# Patient Record
Sex: Female | Born: 1935 | Race: White | Hispanic: No | Smoking: Never smoker
Health system: Southern US, Community
[De-identification: ages and names within clinical notes are randomized; demographics above are authoritative.]

## PROBLEM LIST (undated history)

## (undated) DIAGNOSIS — G259 Extrapyramidal and movement disorder, unspecified: Secondary | ICD-10-CM

## (undated) DIAGNOSIS — M1990 Unspecified osteoarthritis, unspecified site: Secondary | ICD-10-CM

## (undated) DIAGNOSIS — R1084 Generalized abdominal pain: Secondary | ICD-10-CM

## (undated) DIAGNOSIS — I10 Essential (primary) hypertension: Secondary | ICD-10-CM

## (undated) DIAGNOSIS — F329 Major depressive disorder, single episode, unspecified: Secondary | ICD-10-CM

## (undated) DIAGNOSIS — IMO0002 Reserved for concepts with insufficient information to code with codable children: Secondary | ICD-10-CM

## (undated) DIAGNOSIS — F419 Anxiety disorder, unspecified: Secondary | ICD-10-CM

## (undated) DIAGNOSIS — I5032 Chronic diastolic (congestive) heart failure: Secondary | ICD-10-CM

## (undated) DIAGNOSIS — I442 Atrioventricular block, complete: Secondary | ICD-10-CM

## (undated) DIAGNOSIS — M419 Scoliosis, unspecified: Secondary | ICD-10-CM

## (undated) DIAGNOSIS — M810 Age-related osteoporosis without current pathological fracture: Secondary | ICD-10-CM

## (undated) DIAGNOSIS — R911 Solitary pulmonary nodule: Secondary | ICD-10-CM

## (undated) DIAGNOSIS — F32A Depression, unspecified: Secondary | ICD-10-CM

## (undated) DIAGNOSIS — R739 Hyperglycemia, unspecified: Secondary | ICD-10-CM

## (undated) HISTORY — DX: Major depressive disorder, single episode, unspecified: F32.9

## (undated) HISTORY — DX: Depression, unspecified: F32.A

## (undated) HISTORY — DX: Scoliosis, unspecified: M41.9

## (undated) HISTORY — PX: CHOLECYSTECTOMY: SHX55

## (undated) HISTORY — DX: Unspecified osteoarthritis, unspecified site: M19.90

## (undated) HISTORY — DX: Age-related osteoporosis without current pathological fracture: M81.0

## (undated) HISTORY — DX: Extrapyramidal and movement disorder, unspecified: G25.9

## (undated) HISTORY — DX: Reserved for concepts with insufficient information to code with codable children: IMO0002

## (undated) HISTORY — DX: Anxiety disorder, unspecified: F41.9

## (undated) HISTORY — PX: APPENDECTOMY: SHX54

---

## 2010-10-23 ENCOUNTER — Emergency Department (HOSPITAL_COMMUNITY): Payer: Medicare Other

## 2010-10-23 ENCOUNTER — Emergency Department (HOSPITAL_COMMUNITY)
Admission: EM | Admit: 2010-10-23 | Discharge: 2010-10-23 | Disposition: A | Discharge status: Home / Self Care | Payer: Medicare Other | Attending: Emergency Medicine | Admitting: Emergency Medicine

## 2010-10-23 DIAGNOSIS — F411 Generalized anxiety disorder: Secondary | ICD-10-CM | POA: Insufficient documentation

## 2010-10-23 DIAGNOSIS — I517 Cardiomegaly: Secondary | ICD-10-CM | POA: Insufficient documentation

## 2010-10-23 DIAGNOSIS — R197 Diarrhea, unspecified: Secondary | ICD-10-CM | POA: Insufficient documentation

## 2010-10-23 DIAGNOSIS — R109 Unspecified abdominal pain: Secondary | ICD-10-CM | POA: Insufficient documentation

## 2010-10-23 DIAGNOSIS — I10 Essential (primary) hypertension: Secondary | ICD-10-CM | POA: Insufficient documentation

## 2010-10-23 LAB — DIFFERENTIAL
Basophils Absolute: 0 10*3/uL (ref 0.0–0.1)
Basophils Relative: 0 % (ref 0–1)
Eosinophils Absolute: 0 10*3/uL (ref 0.0–0.7)
Eosinophils Relative: 0 % (ref 0–5)
Monocytes Absolute: 1.1 10*3/uL — ABNORMAL HIGH (ref 0.1–1.0)

## 2010-10-23 LAB — COMPREHENSIVE METABOLIC PANEL
ALT: 15 U/L (ref 0–35)
Albumin: 3.9 g/dL (ref 3.5–5.2)
Calcium: 9.6 mg/dL (ref 8.4–10.5)
GFR calc Af Amer: >60 mL/min (ref >60)
Glucose, Bld: 114 mg/dL — ABNORMAL HIGH (ref 70–99)
Sodium: 137 mEq/L (ref 135–145)
Total Protein: 7.8 g/dL (ref 6.0–8.3)

## 2010-10-23 LAB — GLUCOSE, CAPILLARY: Glucose-Capillary: 112 mg/dL — ABNORMAL HIGH (ref 70–99)

## 2010-10-23 LAB — CBC
HCT: 42.3 % (ref 36.0–46.0)
MCHC: 34.3 g/dL (ref 30.0–36.0)
Platelets: 277 10*3/uL (ref 150–400)
RDW: 13.5 % (ref 11.5–15.5)
WBC: 13.5 10*3/uL — ABNORMAL HIGH (ref 4.0–10.5)

## 2010-10-27 ENCOUNTER — Encounter: Payer: Self-pay | Admitting: Internal Medicine

## 2010-10-27 ENCOUNTER — Ambulatory Visit (INDEPENDENT_AMBULATORY_CARE_PROVIDER_SITE_OTHER): Payer: Medicare Other | Admitting: Internal Medicine

## 2010-10-27 ENCOUNTER — Encounter: Payer: Self-pay | Admitting: Family Medicine

## 2010-10-27 DIAGNOSIS — R350 Frequency of micturition: Secondary | ICD-10-CM

## 2010-10-27 DIAGNOSIS — F1120 Opioid dependence, uncomplicated: Secondary | ICD-10-CM

## 2010-10-27 DIAGNOSIS — I10 Essential (primary) hypertension: Secondary | ICD-10-CM

## 2010-10-27 DIAGNOSIS — M549 Dorsalgia, unspecified: Secondary | ICD-10-CM

## 2010-10-27 DIAGNOSIS — G8929 Other chronic pain: Secondary | ICD-10-CM

## 2010-10-27 LAB — POCT URINALYSIS DIPSTICK
Bilirubin, UA: NEGATIVE
Ketones, UA: NEGATIVE
Leukocytes, UA: NEGATIVE
Spec Grav, UA: 1.01
pH, UA: 5.5

## 2010-10-27 MED ORDER — HYDROCODONE-ACETAMINOPHEN 5-500 MG PO TABS: 1.0000 | ORAL_TABLET | Freq: Four times a day (QID) | ORAL | Status: DC | PRN

## 2010-10-28 LAB — DRUG SCREEN, URINE
Amphetamine Screen, Ur: NEGATIVE
Barbiturate Quant, Ur: NEGATIVE
Benzodiazepines.: NEGATIVE
Cocaine Metabolites: NEGATIVE
Marijuana Metabolite: NEGATIVE

## 2010-10-29 ENCOUNTER — Telehealth: Payer: Self-pay

## 2010-10-29 ENCOUNTER — Telehealth: Payer: Self-pay | Admitting: *Deleted

## 2010-10-29 NOTE — Telephone Encounter (Signed)
Called [facility] and receptionist said that she would have pt call back on her cell phone.

## 2010-10-29 NOTE — Telephone Encounter (Addendum)
Pt is calling to have Fosamax called to [facility] ([address]) and to have results of UA called to her,  please.

## 2010-10-29 NOTE — Telephone Encounter (Signed)
Pt returned call. She states that fosamax is listed on the medication list that was given to her from Dr. Rodena Medin, however, I read to her the list of meds that we have on file. It does not include fosamax. She states that she told Dr. Rodena Medin to rx the fosamax because she used to take it in [place] for years. Per Dr. Rodena Medin, there was no mention of fosamax. Pt also did not mention taking fosamax to me when I was entering her medication and asked if there was any other medications that she was on that she did not bring with her. Advised pt that when we get her records from her former pcp in [place], we will then review it for a dx of osteoporosis and an rx of fosamax. I also told pt if she has a rx bottle for the fosamax, then she can bring it to her next ov and we will review from there. Pt verbalized understanding

## 2010-10-29 NOTE — Telephone Encounter (Signed)
Received a fax form [facility] with "New Rx for Fosamax Thank" written across the top of the refill request. Pt did not indicate any use of fosamax to me or Dr. Rodena Medin during her ov on 10/27/2010. Pharmacy notes they received a call from pt stating that Dr. Rodena Medin was suppose to send an rx for fosamax, however Dr. Rodena Medin did not prescribe this medication to the pt. After speaking with the pt's daughter, Misty Stanley, she states that the pt told her that Dr. Rodena Medin said pt has osteoporosis and needs the med. Per Dr. Rodena Medin, he never said anything about fosamax to the pt. Pt would have to be tested for osteoporosis to get that dx. P'st state of mind was questionable while in the office. Dr. Rodena Medin notes that pt mentioned having osteoporosis a few times along with asking for different medication. Pt's daughter stats that pt would not have known the name of the med to tell the pharmacy if it had not been said to her but daughter did note that the family does know that pt gets confused and forgets a lot.

## 2010-11-08 ENCOUNTER — Encounter: Payer: Self-pay | Admitting: Internal Medicine

## 2010-11-08 ENCOUNTER — Other Ambulatory Visit: Payer: Self-pay | Admitting: Internal Medicine

## 2010-11-08 DIAGNOSIS — G8929 Other chronic pain: Secondary | ICD-10-CM | POA: Insufficient documentation

## 2010-11-08 DIAGNOSIS — R350 Frequency of micturition: Secondary | ICD-10-CM | POA: Insufficient documentation

## 2010-11-08 NOTE — Assessment & Plan Note (Signed)
Obtain urinalysis 

## 2010-11-08 NOTE — Assessment & Plan Note (Signed)
No records available. Cannot confirm long-term use of narcotics. No evidence of withdrawal clinically. Agreed to short-term narcotics with Vicodin p.r.n. to be used sparingly. Urine drug screen today. Advise will not continue narcotics long-term and recommend pain clinic referral. Patient agreeable and appointment will be made.

## 2010-11-08 NOTE — Assessment & Plan Note (Signed)
suboptimal control contributed to by noncompliance. Asymptomatic. Urged patient to resume her blood pressure medication and it is refilled today. Monitor blood pressure as an outpatient and followup in clinic as scheduled

## 2010-11-08 NOTE — Progress Notes (Signed)
  Subjective:    Patient ID: Darlene Parker, female    DOB: 1935-09-04, 75 y.o.   MRN: [id number]  HPI patient presents to clinic to establish care and for evaluation of chronic pain. Recent emergency department records are reviewed however no other records are available at this time. Patient states she recently moved from [place] and has chronic back pain and takes narcotics chronically specifically opana. Her emergency department visit documentation states that she complained of diarrhea and anxiety being out of pain medication but declined pain medication. Was given a prescription for Ativan which she states does not help her sleep or anxiety. Does not currently demonstrate any evidence of agitation suggestive of withdrawal but does have a tremor that she states is chronic and familial. Has history of hypertension and blood pressure is elevated and she admits she is not taking her blood pressure medications as prescribed. Complains of recent several-day history of urinary frequency and dark urine without dysuria or hematuria. No alleviating or exacerbating factors.   Reviewed past medical history, past surgical history, medications, allergies, social history and family history  Review of Systems  Constitutional: Negative for fever and chills.  HENT: Negative for congestion and rhinorrhea.   Eyes: Negative for pain and redness.  Respiratory: Negative for cough and shortness of breath.   Cardiovascular: Negative for chest pain and palpitations.  Gastrointestinal: Negative for nausea and abdominal pain.  Genitourinary: Positive for frequency. Negative for urgency, hematuria and decreased urine volume.  Musculoskeletal: Positive for back pain. Negative for gait problem.  Skin: Negative for rash and wound.  Neurological: Positive for tremors. Negative for weakness.  Psychiatric/Behavioral: Negative for agitation. The patient is nervous/anxious.        Objective:   Physical Exam  Nursing note  and vitals reviewed. Constitutional: She appears well-developed and well-nourished. No distress.  HENT:  Head: Normocephalic and atraumatic.  Right Ear: External ear normal.  Left Ear: External ear normal.  Nose: Nose normal.  Eyes: Conjunctivae are normal. Pupils are equal, round, and reactive to light. Right eye exhibits no discharge. Left eye exhibits no discharge. No scleral icterus. Right eye exhibits no nystagmus.  Neck: Neck supple.  Cardiovascular: Normal rate and regular rhythm.  Exam reveals no gallop and no friction rub.   No murmur heard. Pulmonary/Chest: Effort normal and breath sounds normal. No respiratory distress. She has no wheezes. She has no rales.  Lymphadenopathy:    She has no cervical adenopathy.  Neurological: She is alert.  Skin: Skin is warm and dry. No rash noted. She is not diaphoretic. No erythema.  Psychiatric: She has a normal mood and affect.          Assessment & Plan:

## 2010-11-09 ENCOUNTER — Other Ambulatory Visit: Payer: Self-pay | Admitting: Internal Medicine

## 2010-11-09 NOTE — Telephone Encounter (Signed)
Left message with front desk receptionist at [facility] for pt to call offc to discuss med refills.   Pt has not returned call as of yet. Will await call before any refills are given.

## 2010-11-11 ENCOUNTER — Telehealth: Payer: Self-pay | Admitting: Internal Medicine

## 2010-11-11 ENCOUNTER — Other Ambulatory Visit: Payer: Self-pay

## 2010-11-11 MED ORDER — LORAZEPAM 1 MG PO TABS: 1.0000 mg | ORAL_TABLET | Freq: Three times a day (TID) | ORAL | Status: DC | PRN

## 2010-11-11 NOTE — Telephone Encounter (Signed)
Pt called and is wondering why she is only being prescribed a 30 day supply on pain meds. Pt said that the Vicodin is not strong enough. Pt also says that Ativan is not helping her with sleep.  Pt is req to get a stronger pain med and different sleep called in to [facility] on [address].

## 2010-11-12 NOTE — Telephone Encounter (Signed)
Spoke with pt. Per Dr. Rodena Medin, make pt aware that he is not comfortable prescribing anymore narcotics to the pt without having seen her medical records form [place]. We are in the process of trying to get pt an appt with a pain clinic and ativan is not a sleep aid, but if pt would like a rx for ambien then Dr. Sharlot Gowda will do that when pt is off the ativan..when pt's urine was drug tested, it was clean for all drugs including benzo's, so there is question about where the ativan is going if it is not being taken by the pt.  Pt demands to speak to Dr. Rodena Medin or for him to give her refills and stronger pain meds, meds for sleep, and meds for anxiety or she states that he (Dr. Rodena Medin) needs to find her another MD.

## 2010-11-12 NOTE — Telephone Encounter (Signed)
I have several concerns.   During first visit requested opana be refilled. Appeared lethargic and repeatedly requested high dose narcotics, sleeping medication, anxiety medication and ?stimulant medication for daytime to help her stay awake. No records are currently available from her previous [place] physician. Received ativan from recent ED visit and denied pain there. Urine drug screen in clinic showed no benzodiazepines or opiates. No evidence of withdrawal during visit.  Agreed to provide patient with short acting narcotics short term until pain clinic appt obtained(attempting referral currently pending). She refilled #30 hydrocodone ~ 4 days after originally filled. Has called requesting ativan rf and now demanding increase in narcotic, further benzodiazepines and sleeping medication.  Indicated to nursing that she wishes to change physicians.  I have concerns over her use of controlled substances and do not feel comfortable in providing what she is requesting. I whole heartedly agree with her request to change providers to one that may feel more comfortable with what she is asking for.    As she has a recent prescription for ativan and hydrocodone and has demonstrated no evidence of withdrawal (despite neg drug screen) I feel she is stable to transfer to another physician at this point.

## 2010-11-13 ENCOUNTER — Encounter: Payer: Self-pay | Admitting: *Deleted

## 2010-11-17 ENCOUNTER — Encounter: Payer: Self-pay | Admitting: Internal Medicine

## 2010-11-17 ENCOUNTER — Ambulatory Visit (INDEPENDENT_AMBULATORY_CARE_PROVIDER_SITE_OTHER): Payer: Medicare Other | Admitting: Internal Medicine

## 2010-11-17 DIAGNOSIS — G8929 Other chronic pain: Secondary | ICD-10-CM

## 2010-11-17 DIAGNOSIS — G4700 Insomnia, unspecified: Secondary | ICD-10-CM

## 2010-11-17 DIAGNOSIS — I10 Essential (primary) hypertension: Secondary | ICD-10-CM

## 2010-11-17 DIAGNOSIS — M549 Dorsalgia, unspecified: Secondary | ICD-10-CM

## 2010-11-17 MED ORDER — OXYCODONE-ACETAMINOPHEN 7.5-325 MG PO TABS: 1.0000 | ORAL_TABLET | Freq: Four times a day (QID) | ORAL | Status: DC | PRN

## 2010-11-17 MED ORDER — ZOLPIDEM TARTRATE 5 MG PO TABS: 5.0000 mg | ORAL_TABLET | Freq: Every evening | ORAL | Status: DC | PRN

## 2010-11-22 ENCOUNTER — Encounter: Payer: Self-pay | Admitting: Internal Medicine

## 2010-11-22 DIAGNOSIS — G4700 Insomnia, unspecified: Secondary | ICD-10-CM | POA: Insufficient documentation

## 2010-11-22 NOTE — Assessment & Plan Note (Signed)
Long discussion held with patient. I have concerns about her recent behavior involving controlled substances. Do not feel comfortable with long-acting narcotic prescription. Strongly encouraged pain clinic referral which is currently pending. Have again indicated I will not provide long-term narcotic prescriptions and she states understanding. Change Vicodin to Percocet with limited quantity.

## 2010-11-22 NOTE — Progress Notes (Signed)
  Subjective:    Patient ID: Darlene Parker, female    DOB: 04-23-1936, 75 y.o.   MRN: [id number]  HPI patient presents to clinic for followup of chronic pain. Suffers from chronic back pain recently moved from [place] and no records are available. States had previously been prescribed opana. Drug screen last visit entirely negative and had no evidence of withdrawal. He agreed to place patient on short-term p.r.n. Narcotic until pain clinic referral completed. Review of chart, narcotic database indicates patient filled #30 hydrocodone after last visit and refilled #30 4 days later.  This was confirmed by her pharmacy. Patient denies this. Discuss recent phone call to the nursing staff made by patient. Nursing staff indicates patient stated if I did not give her narcotics and she wished that she desired another physician. Denies this currently. Also was given Ativan for anxiety at the emergency department and at last visit stated the medication did not help very much. I reviewed her drug screen showing no evidence of benzodiazepines. She states she does continue to take the medication. Patient requesting medication for insomnia indicating she is taking prescription medication for this in the past. Patient with history of hypertension with improved control today as she is now apparently resumed her blood pressure medication as instructed at last visit. No other current complaints  Reviewed past medical history, medications and allergies  Review of Systems see history of present illness     Objective:   Physical Exam  [nursing notereviewed. Constitutional: She appears well-developed and well-nourished. No distress.  Neurological: She is alert.  Skin: Skin is warm and dry. She is not diaphoretic.  Psychiatric: Her mood appears not anxious. Her affect is not angry, not blunt, not labile and not inappropriate. Her speech is not rapid and/or pressured, not delayed, not tangential and not slurred. She is  not agitated, not aggressive, is not hyperactive, not slowed, not withdrawn, not actively hallucinating and not combative. Thought content is not paranoid and not delusional. She does not exhibit a depressed mood. She is communicative. She is attentive.          Assessment & Plan:

## 2010-11-22 NOTE — Assessment & Plan Note (Signed)
Begin low-dose Ambien and limited quantities. Upon discussion of this patient immediately requested high dose of medication. Will provide low dose only.  Addendum: Patient instructed to wait in the room for printed prescriptions and stated understanding. approximately 2 minutes later left the room and informed nursing that she was told she was discharged. Began to request phentermine. Request denied.

## 2010-11-22 NOTE — Assessment & Plan Note (Signed)
Improving control. Encouraged compliance

## 2010-11-24 ENCOUNTER — Telehealth: Payer: Self-pay | Admitting: Internal Medicine

## 2010-11-24 NOTE — Telephone Encounter (Signed)
Returned call to pt twice

## 2010-11-24 NOTE — Telephone Encounter (Signed)
Pt called req to get and FL-2 form to be completed by Dr Rodena Medin and then faxed to her fax # [phone] attn [facility] c/o Genelle Bal.

## 2010-12-03 ENCOUNTER — Other Ambulatory Visit: Payer: Self-pay

## 2010-12-03 MED ORDER — LORAZEPAM 1 MG PO TABS: 1.0000 mg | ORAL_TABLET | Freq: Three times a day (TID) | ORAL | Status: DC | PRN

## 2010-12-03 MED ORDER — OXYCODONE-ACETAMINOPHEN 7.5-325 MG PO TABS: 1.0000 | ORAL_TABLET | Freq: Four times a day (QID) | ORAL | Status: DC | PRN

## 2010-12-03 NOTE — Telephone Encounter (Signed)
Done. Pt aware rx'es ready to pick up

## 2010-12-03 NOTE — Telephone Encounter (Signed)
Ok to fill #60 percocet and #60 ativan. Remind that will not fill narcotics long term. Await pain clinic appt.

## 2010-12-03 NOTE — Telephone Encounter (Signed)
Pt left message stating that she does not have enough Percocet and Ativan to last her until the end of the month. Notes she has been taking them as directed and has 5 pain pills and 4 or 5 ativan left. Please advise.   Pain Clinic referral still pending

## 2010-12-04 ENCOUNTER — Telehealth: Payer: Self-pay

## 2010-12-04 ENCOUNTER — Other Ambulatory Visit: Payer: Self-pay

## 2010-12-04 MED ORDER — CLONAZEPAM 1 MG PO TABS: 1.0000 mg | ORAL_TABLET | Freq: Two times a day (BID) | ORAL | Status: DC | PRN

## 2010-12-04 NOTE — Telephone Encounter (Signed)
Done and pt aware

## 2010-12-04 NOTE — Telephone Encounter (Signed)
Confirmed with pharmacy. May change lorazepam to klonopin 1mg  po bid #60 no rf

## 2010-12-04 NOTE — Telephone Encounter (Signed)
Pt left message stating that her insurance does not cover ativan so she would like to switch to clonazepam.

## 2010-12-04 NOTE — Telephone Encounter (Signed)
Pt came into office today to pick up 2 prescriptions at which time she demanded (banging her pill bottles on the counter) that Dr. Rodena Medin increase her Ativan rx and give her something for her nerves. I explained to the pt that Ativan is for nerves/anxiety and that Dr. Rodena Medin is not going to increase it because he is uncomfortable with prescribing the meds due to the fact that we have yet to receive medical records on pt from previous physician's office. Pt then demanded that Dr. Rodena Medin give her another doctor...one who will give her the medicines that she wants, per pt. She proceeded to lay pills (Ativan) out on the counter, saying that the pills are too small so how could they work. I told the pt that I have no control over the size of the pill. She ended with me saying she wants another doctor.

## 2010-12-08 ENCOUNTER — Encounter: Payer: Self-pay | Admitting: *Deleted

## 2010-12-09 ENCOUNTER — Other Ambulatory Visit: Payer: Self-pay

## 2010-12-09 NOTE — Telephone Encounter (Signed)
Fax refill request for ambien 5 mg #30 has been denied by Dr. Rodena Medin. Reason: pt should not be out of [place]. She was given an rx for a 30 day supply on May 29/ 2012

## 2010-12-14 ENCOUNTER — Other Ambulatory Visit: Payer: Self-pay

## 2010-12-14 NOTE — Telephone Encounter (Signed)
Rx refill request for ambien denied, per Dr. Rodena Medin. Too soon to refill as rx was written 11/17/10

## 2010-12-16 ENCOUNTER — Telehealth: Payer: Self-pay | Admitting: *Deleted

## 2010-12-16 NOTE — Telephone Encounter (Signed)
Pt has contacted [facility] requesting assistance.  [name] from [place] states she has been on the phone with the patient for about 30 minutes and is having difficulty in understanding what the patient needs.  She assumes the patient needs a Set designer and also thinks social services needs to be involved and possible the patient needs to be moved to another facility.  I advised [name] that patient has been discharged from our office.  However she is requesting a call back to see what Dr. Rodena Medin feels would be beneficial to the patient as far as treatment is concerned.

## 2010-12-17 ENCOUNTER — Telehealth: Payer: Self-pay

## 2010-12-17 NOTE — Telephone Encounter (Signed)
Pt entered office requesting medication refills for the following meds: Ambien of which she has 3 pills left, Percocet of which she has 6 left, and Ativan that appeared to be 10 or more.  Pt also requests that an FL-2 form be completed on her behalf. Pt was advised that there has to be an Assisted Living Home chosen before the facility can send the form to be considered for completion.  Pt stated that she has not received the certified discharge letter as of today.

## 2010-12-18 ENCOUNTER — Other Ambulatory Visit: Payer: Self-pay

## 2010-12-18 ENCOUNTER — Telehealth: Payer: Self-pay

## 2010-12-18 MED ORDER — ZOLPIDEM TARTRATE 5 MG PO TABS: 5.0000 mg | ORAL_TABLET | Freq: Every evening | ORAL | Status: DC | PRN

## 2010-12-18 MED ORDER — OXYCODONE-ACETAMINOPHEN 7.5-325 MG PO TABS: 1.0000 | ORAL_TABLET | Freq: Four times a day (QID) | ORAL | Status: AC | PRN

## 2010-12-18 NOTE — Telephone Encounter (Signed)
Done and pt is aware 

## 2010-12-18 NOTE — Telephone Encounter (Signed)
rx faxed to pharmacy per pt request

## 2010-12-18 NOTE — Telephone Encounter (Signed)
Fill ambien and percocet.

## 2010-12-18 NOTE — Telephone Encounter (Signed)
i am unclear what they are requesting from the phone call. As you said she has been discharged from the practice and is eligible for 30 days emergency care. Did she receive the registered letter or did we receive the refusal? Was she told of discharge when she came to clinic yesterday?

## 2010-12-18 NOTE — Telephone Encounter (Signed)
Pt notified scripts ready to pick up

## 2010-12-18 NOTE — Telephone Encounter (Signed)
done

## 2010-12-18 NOTE — Telephone Encounter (Signed)
Discharge letter mailed to patient, however she states she has not received.  Waiting to hear back from Medical Records to see what the status of the letter is, was if refused by patient or was it signed and the patient has received.  At this point I do not know.    [name] from [place] has no medical hx on this patient as was reaching out to see if patient has medical or mental illness that would benefit her to have a home health nurse, or possible help from social services.  I think she wants to know if you feel the patient would benefit from either of these services based on your interactions with the patient.

## 2010-12-22 ENCOUNTER — Telehealth: Payer: Self-pay

## 2010-12-22 NOTE — Telephone Encounter (Signed)
Pt left message requesting a call back in reference to Dr. Rodena Medin calling [name] (person named in a previous phone note).   Pt was asked if she received a letter form Dr. Rodena Medin. She states that she did receive the discharge letter 2-3 days ago.  Returned call to pt to advise that Dr. Rodena Medin is unable to speak with [name] due to pt's discharge from the practice. Pt states that [name] wants to speak with Dr. Rodena Medin so verify her being discharged. Pt states that she will allow [name] to see the discharge letter to see if that will be sufficient for proof of discharge from practice.

## 2011-01-14 ENCOUNTER — Emergency Department (HOSPITAL_COMMUNITY): Payer: Medicare Other

## 2011-01-14 ENCOUNTER — Emergency Department (HOSPITAL_COMMUNITY)
Admission: EM | Admit: 2011-01-14 | Discharge: 2011-01-14 | Disposition: A | Discharge status: Home / Self Care | Payer: Medicare Other | Attending: Emergency Medicine | Admitting: Emergency Medicine

## 2011-01-14 DIAGNOSIS — I4510 Unspecified right bundle-branch block: Secondary | ICD-10-CM | POA: Insufficient documentation

## 2011-01-14 DIAGNOSIS — M7989 Other specified soft tissue disorders: Secondary | ICD-10-CM | POA: Insufficient documentation

## 2011-01-14 DIAGNOSIS — E669 Obesity, unspecified: Secondary | ICD-10-CM | POA: Insufficient documentation

## 2011-01-14 DIAGNOSIS — M79609 Pain in unspecified limb: Secondary | ICD-10-CM | POA: Insufficient documentation

## 2011-01-14 DIAGNOSIS — I440 Atrioventricular block, first degree: Secondary | ICD-10-CM | POA: Insufficient documentation

## 2011-01-14 DIAGNOSIS — I2510 Atherosclerotic heart disease of native coronary artery without angina pectoris: Secondary | ICD-10-CM | POA: Insufficient documentation

## 2011-01-14 DIAGNOSIS — R609 Edema, unspecified: Secondary | ICD-10-CM | POA: Insufficient documentation

## 2011-01-14 DIAGNOSIS — I10 Essential (primary) hypertension: Secondary | ICD-10-CM | POA: Insufficient documentation

## 2011-02-18 ENCOUNTER — Other Ambulatory Visit: Payer: Self-pay | Admitting: Neurology

## 2011-02-18 DIAGNOSIS — M47817 Spondylosis without myelopathy or radiculopathy, lumbosacral region: Secondary | ICD-10-CM

## 2011-03-10 ENCOUNTER — Other Ambulatory Visit: Payer: Self-pay

## 2011-03-10 ENCOUNTER — Ambulatory Visit
Admission: RE | Admit: 2011-03-10 | Discharge: 2011-03-10 | Disposition: A | Discharge status: Home / Self Care | Payer: Medicare Other | Source: Ambulatory Visit | Attending: Neurology | Admitting: Neurology

## 2011-03-10 DIAGNOSIS — M47817 Spondylosis without myelopathy or radiculopathy, lumbosacral region: Secondary | ICD-10-CM

## 2011-03-24 ENCOUNTER — Other Ambulatory Visit: Payer: Self-pay | Admitting: Neurology

## 2011-03-24 DIAGNOSIS — M5410 Radiculopathy, site unspecified: Secondary | ICD-10-CM

## 2011-03-29 ENCOUNTER — Ambulatory Visit
Admission: RE | Admit: 2011-03-29 | Discharge: 2011-03-29 | Disposition: A | Discharge status: Home / Self Care | Payer: Medicare Other | Source: Ambulatory Visit | Attending: Neurology | Admitting: Neurology

## 2011-03-29 DIAGNOSIS — M5410 Radiculopathy, site unspecified: Secondary | ICD-10-CM

## 2011-03-29 MED ORDER — METHYLPREDNISOLONE ACETATE 40 MG/ML INJ SUSP (RADIOLOG
120.0000 mg | Freq: Once | INTRAMUSCULAR | Status: AC
  Administered 2011-03-29: 120 mg via EPIDURAL

## 2011-03-29 MED ORDER — IOHEXOL 180 MG/ML  SOLN
1.0000 mL | Freq: Once | INTRAMUSCULAR | Status: AC | PRN
  Administered 2011-03-29: 1 mL via EPIDURAL

## 2011-03-29 NOTE — Patient Instructions (Signed)

## 2011-03-29 NOTE — Progress Notes (Signed)
Pt very nervous and tearful, took own pain meds upon discharge. Seemed very confused about how and why she had an injection, even after long explanation from Dr. Alfredo Batty. Pt asked to follow up with Dr. Anne Hahn.

## 2011-04-12 ENCOUNTER — Other Ambulatory Visit: Payer: Self-pay | Admitting: Neurology

## 2011-04-12 DIAGNOSIS — R51 Headache: Secondary | ICD-10-CM

## 2011-04-21 ENCOUNTER — Ambulatory Visit
Admission: RE | Admit: 2011-04-21 | Discharge: 2011-04-21 | Disposition: A | Discharge status: Home / Self Care | Payer: Medicare Other | Source: Ambulatory Visit | Attending: Neurology | Admitting: Neurology

## 2011-04-21 ENCOUNTER — Other Ambulatory Visit: Payer: Medicare Other

## 2011-04-21 DIAGNOSIS — R51 Headache: Secondary | ICD-10-CM

## 2011-05-28 ENCOUNTER — Other Ambulatory Visit: Payer: Self-pay | Admitting: Obstetrics and Gynecology

## 2011-05-28 DIAGNOSIS — Z1231 Encounter for screening mammogram for malignant neoplasm of breast: Secondary | ICD-10-CM

## 2011-06-02 ENCOUNTER — Ambulatory Visit: Payer: Medicare Other

## 2011-06-02 ENCOUNTER — Ambulatory Visit
Admission: RE | Admit: 2011-06-02 | Discharge: 2011-06-02 | Disposition: A | Discharge status: Home / Self Care | Payer: Medicare Other | Source: Ambulatory Visit | Attending: Obstetrics and Gynecology | Admitting: Obstetrics and Gynecology

## 2011-06-02 DIAGNOSIS — Z1231 Encounter for screening mammogram for malignant neoplasm of breast: Secondary | ICD-10-CM

## 2012-07-10 ENCOUNTER — Other Ambulatory Visit: Payer: Self-pay | Admitting: Family Medicine

## 2012-07-10 DIAGNOSIS — R911 Solitary pulmonary nodule: Secondary | ICD-10-CM

## 2012-07-11 ENCOUNTER — Encounter: Payer: Self-pay | Admitting: Physical Medicine & Rehabilitation

## 2012-07-12 ENCOUNTER — Ambulatory Visit
Admission: RE | Admit: 2012-07-12 | Discharge: 2012-07-12 | Disposition: A | Discharge status: Home / Self Care | Payer: Medicare HMO | Source: Ambulatory Visit | Attending: Family Medicine | Admitting: Family Medicine

## 2012-07-12 DIAGNOSIS — R911 Solitary pulmonary nodule: Secondary | ICD-10-CM

## 2012-07-12 MED ORDER — IOHEXOL 300 MG/ML  SOLN
75.0000 mL | Freq: Once | INTRAMUSCULAR | Status: AC | PRN
  Administered 2012-07-12: 75 mL via INTRAVENOUS

## 2012-07-31 ENCOUNTER — Ambulatory Visit: Payer: Medicare HMO | Admitting: Physical Medicine & Rehabilitation

## 2012-08-11 ENCOUNTER — Encounter: Payer: Medicare HMO | Attending: Physical Medicine & Rehabilitation

## 2012-08-11 ENCOUNTER — Ambulatory Visit: Payer: Medicare HMO | Admitting: Physical Medicine & Rehabilitation

## 2012-10-31 ENCOUNTER — Telehealth: Payer: Self-pay | Admitting: *Deleted

## 2012-10-31 NOTE — Telephone Encounter (Signed)
I called and left a message for the patient to callback to the office because no one could understand the message.

## 2012-11-02 ENCOUNTER — Other Ambulatory Visit: Payer: Self-pay

## 2012-11-02 MED ORDER — GABAPENTIN 100 MG PO CAPS: 200.0000 mg | ORAL_CAPSULE | Freq: Three times a day (TID) | ORAL | Status: DC

## 2012-11-03 ENCOUNTER — Other Ambulatory Visit: Payer: Self-pay

## 2012-11-03 MED ORDER — TRAMADOL HCL 50 MG PO TABS: 50.0000 mg | ORAL_TABLET | Freq: Four times a day (QID) | ORAL | Status: DC | PRN

## 2012-11-03 MED ORDER — TRAMADOL HCL 50 MG PO TABS
50.0000 mg | ORAL_TABLET | Freq: Four times a day (QID) | ORAL | Status: DC | PRN
Start: 2012-11-03 — End: 2012-12-19

## 2012-11-03 NOTE — Telephone Encounter (Signed)
Rx did not print, reprinted.   

## 2012-11-09 ENCOUNTER — Emergency Department (HOSPITAL_COMMUNITY): Payer: Medicare HMO

## 2012-11-09 ENCOUNTER — Emergency Department (HOSPITAL_COMMUNITY)
Admission: EM | Admit: 2012-11-09 | Discharge: 2012-11-10 | Disposition: A | Discharge status: Home / Self Care | Payer: Medicare HMO | Attending: Emergency Medicine | Admitting: Emergency Medicine

## 2012-11-09 ENCOUNTER — Encounter (HOSPITAL_COMMUNITY): Payer: Self-pay | Admitting: Emergency Medicine

## 2012-11-09 DIAGNOSIS — Z8739 Personal history of other diseases of the musculoskeletal system and connective tissue: Secondary | ICD-10-CM | POA: Insufficient documentation

## 2012-11-09 DIAGNOSIS — R0789 Other chest pain: Secondary | ICD-10-CM | POA: Insufficient documentation

## 2012-11-09 DIAGNOSIS — Z87891 Personal history of nicotine dependence: Secondary | ICD-10-CM | POA: Insufficient documentation

## 2012-11-09 DIAGNOSIS — I10 Essential (primary) hypertension: Secondary | ICD-10-CM | POA: Insufficient documentation

## 2012-11-09 DIAGNOSIS — R0602 Shortness of breath: Secondary | ICD-10-CM | POA: Insufficient documentation

## 2012-11-09 DIAGNOSIS — Z79899 Other long term (current) drug therapy: Secondary | ICD-10-CM | POA: Insufficient documentation

## 2012-11-09 DIAGNOSIS — R079 Chest pain, unspecified: Secondary | ICD-10-CM

## 2012-11-09 LAB — BASIC METABOLIC PANEL
Calcium: 9.2 mg/dL (ref 8.4–10.5)
GFR calc Af Amer: 75 mL/min — ABNORMAL LOW (ref >90)
GFR calc non Af Amer: 65 mL/min — ABNORMAL LOW (ref >90)
Potassium: 3.5 mEq/L (ref 3.5–5.1)
Sodium: 137 mEq/L (ref 135–145)

## 2012-11-09 LAB — CBC WITH DIFFERENTIAL/PLATELET
Basophils Absolute: 0 10*3/uL (ref 0.0–0.1)
Basophils Relative: 0 % (ref 0–1)
Eosinophils Absolute: 0.3 10*3/uL (ref 0.0–0.7)
Eosinophils Relative: 4 % (ref 0–5)
Lymphs Abs: 2.3 10*3/uL (ref 0.7–4.0)
MCH: 31.3 pg (ref 26.0–34.0)
MCV: 91.9 fL (ref 78.0–100.0)
Neutrophils Relative %: 55 % (ref 43–77)
Platelets: 228 10*3/uL (ref 150–400)
RBC: 4.47 MIL/uL (ref 3.87–5.11)
RDW: 13.5 % (ref 11.5–15.5)

## 2012-11-09 LAB — POCT I-STAT TROPONIN I: Troponin i, poc: 0 ng/mL (ref 0.00–0.08)

## 2012-11-09 MED ORDER — ASPIRIN 325 MG PO TABS
325.0000 mg | ORAL_TABLET | Freq: Once | ORAL | Status: AC
  Administered 2012-11-09: 325 mg via ORAL
  Filled 2012-11-09: qty 1

## 2012-11-09 NOTE — ED Provider Notes (Signed)
History     CSN: [id number]  Arrival date & time 11/09/12  2147   First MD Initiated Contact with Patient 11/09/12 2147      Chief Complaint  Patient presents with  . Chest Pain    Patient is a 77 y.o. female presenting with chest pain. The history is provided by the patient and the EMS personnel.  Chest Pain Pain location:  Substernal area Pain quality: pressure   Pain radiates to:  Does not radiate Pain severity:  Moderate Onset quality:  Gradual Duration: several hours ago. Timing:  Constant Progression:  Improving Chronicity:  New Relieved by:  Nitroglycerin Worsened by:  Nothing tried Associated symptoms: shortness of breath   Associated symptoms: no abdominal pain and not vomiting     Past Medical History  Diagnosis Date  . Arthritis     RA  . Scoliosis   . Hypertension     Past Surgical History  Procedure Laterality Date  . Appendectomy    . Cholecystectomy      No family history on file.  History  Substance Use Topics  . Smoking status: Former Games developer  . Smokeless tobacco: Not on file  . Alcohol Use: Yes    OB History   Grav Para Term Preterm Abortions TAB SAB Ect Mult Living                  Review of Systems  Respiratory: Positive for shortness of breath.   Cardiovascular: Positive for chest pain.  Gastrointestinal: Negative for vomiting and abdominal pain.  All other systems reviewed and are negative.    Allergies  Review of patient's allergies indicates no known allergies.  Home Medications   Current Outpatient Rx  Name  Route  Sig  Dispense  Refill  . gabapentin (NEURONTIN) 300 MG capsule   Oral   Take 300 mg by mouth 3 (three) times daily.         Marland Kitchen lisinopril (PRINIVIL,ZESTRIL) 20 MG tablet   Oral   Take 20 mg by mouth daily.         . mirtazapine (REMERON) 15 MG tablet   Oral   Take 15 mg by mouth at bedtime.         . nitroGLYCERIN (NITROSTAT) 0.4 MG SL tablet   Sublingual   Place 0.4 mg under the tongue  every 5 (five) minutes as needed.           . simvastatin (ZOCOR) 20 MG tablet   Oral   Take 20 mg by mouth every evening.         . traMADol (ULTRAM) 50 MG tablet   Oral   Take 1 tablet (50 mg total) by mouth every 6 (six) hours as needed (MUST LAST 28 DAYS).   60 tablet   1     Pharmacy Fax [phone]     BP 134/79  Pulse 105  Temp(Src) 98.7 F (37.1 C) (Oral)  Resp 16  SpO2 96% BP 139/83  Pulse 82  Temp(Src) 98.7 F (37.1 C) (Oral)  Resp 27  SpO2 99%   Physical Exam CONSTITUTIONAL: Well developed/well nourished, ill appearing HEAD: Normocephalic/atraumatic EYES: EOMI/PERRL ENMT: Mucous membranes moist NECK: supple no meningeal signs SPINE:entire spine nontender CV: S1/S2 noted, no murmurs/rubs/gallops noted LUNGS: Lungs are clear to auscultation bilaterally, no apparent distress ABDOMEN: soft, nontender, no rebound or guarding GU:no cva tenderness NEURO: Pt is awake/alert, moves all extremitiesx4 EXTREMITIES: pulses normal, full ROM SKIN: warm, color normal PSYCH: anxious, agitated  ED Course  Procedures  Labs Reviewed  CBC WITH DIFFERENTIAL  BASIC METABOLIC PANEL  POCT I-STAT TROPONIN I   Dg Chest Portable 1 View  11/09/2012   *RADIOLOGY REPORT*  Clinical Data: Left-sided chest pain and shortness of breath.  PORTABLE CHEST - 1 VIEW  Comparison: Chest x-ray 01/14/2011.  Findings: The heart is enlarged but stable.  There is tortuosity, ectasia and calcification of the thoracic aorta.  The pulmonary hila appear stable.  There are mild underlying chronic bronchitic changes and superimposed bibasilar atelectasis or infiltrates.  No pleural effusion or pulmonary edema.  The bony thorax is intact.  IMPRESSION: Bibasilar atelectasis versus infiltrates.   Original Report Authenticated By: Rudie Meyer, M.D.   Pt seen on arrival Initial EKG was negative for STEMI Pt now reports her CP is improved  12:23 AM Pt improved in the ED She is awake/alert and denies  recurrent CP Her initial CXR/labs are unremarkable She denied pleuritic CP I advised admission for her CP.  I advised that this could be unstable angina, and she could have recurrent CP that could lead to death/disability.  Pt requests to be discharged and f/u as outpatient I had extensive discussions with her son as well and advised him of advice for admission.  Pt still would like to go home Will start her on daily ASA, and also given referrals to PCP and cardiology  I discussed risk of death/disability of leaving against medical advice and the patient accepts these risks.  The patient is awake/alet able to make decisions, and not intoxicated Patient discharged against medical advice.    MDM  Nursing notes including past medical history and social history reviewed and considered in documentation xrays reviewed and considered Labs/vital reviewed and considered        Date: 11/09/2012  Rate: 129  Rhythm: sinus tachycardia  QRS Axis: left  Intervals: normal  ST/T Wave abnormalities: nonspecific ST changes  Conduction Disutrbances:right bundle branch block  Narrative Interpretation:   Old EKG Reviewed: none available    Joya Gaskins, MD 11/10/12 0025

## 2012-11-09 NOTE — ED Notes (Signed)
PT. ARRIVED WITH EMS FROM HOME REPORTS LEFT CHEST PAIN WITH SOB ONSET THIS EVENING WITH SLIGHT SOB , NO NAUSEA OR DIAPHORESIS.

## 2012-11-10 MED ORDER — ASPIRIN EC 325 MG PO TBEC: 325.0000 mg | DELAYED_RELEASE_TABLET | Freq: Every day | ORAL | Status: DC

## 2012-11-24 ENCOUNTER — Telehealth: Payer: Self-pay

## 2012-11-24 MED ORDER — KETOROLAC TROMETHAMINE 10 MG PO TABS: 10.0000 mg | ORAL_TABLET | Freq: Four times a day (QID) | ORAL | Status: DC | PRN

## 2012-11-24 NOTE — Telephone Encounter (Signed)
Patient called saying it is an emergency, that she must get an early refill authorized on her Tramadol.  Last filled 5/16, must last 28 days, it is too soon.  Patient says she needs to get at least half of the Rx filled to last her until she is due for her next refill.  Says she has back pain and cannot go the weekend without meds.  Says she has been looking for another doctor, but has been unable to find one, and we are the only doctor that takes care of her.  Would like a message forwarded to provider requesting early fill on pain med.  Please advise.  Thank you.

## 2012-11-24 NOTE — Telephone Encounter (Signed)
The Ultram is not due until 12/01/2012. I'll call in a small prescription for ketorolac.

## 2012-11-29 ENCOUNTER — Telehealth: Payer: Self-pay | Admitting: Neurology

## 2012-11-29 NOTE — Telephone Encounter (Signed)
Rx was just written on 06/06 for #20 + 1 refill.  I called the patient.  Got no answer.  I called the pharmacy.  Spoke with [employer].  He said she already used the first fill and does have one refill remaining, but they have not filled it.  They did not think patient should need a refill so quickly.  Okay for them to fill Rx again?  Please advise.  Thank you.

## 2012-11-29 NOTE — Telephone Encounter (Signed)
The patient apparently is taking 1 tablet every 6 hours on a scheduled basis, not if needed. The patient has gone through 20 tablets in 5 days. The patient still is too early for her Ultram prescription. The patient is going through all of her pain medications too quickly. I called the patient and left a message to be careful about using too much medication.

## 2012-12-19 ENCOUNTER — Telehealth: Payer: Self-pay | Admitting: *Deleted

## 2012-12-19 MED ORDER — TRAMADOL HCL 50 MG PO TABS: 50.0000 mg | ORAL_TABLET | Freq: Four times a day (QID) | ORAL | Status: DC | PRN

## 2012-12-19 NOTE — Telephone Encounter (Signed)
I called patient. She indicated that she needs another refill for the Ultram. I'll call the prescription in

## 2012-12-20 ENCOUNTER — Telehealth: Payer: Self-pay | Admitting: Neurology

## 2012-12-20 ENCOUNTER — Other Ambulatory Visit: Payer: Self-pay | Admitting: Neurology

## 2012-12-20 MED ORDER — TRAMADOL HCL 50 MG PO TABS: 50.0000 mg | ORAL_TABLET | Freq: Four times a day (QID) | ORAL | Status: DC | PRN

## 2012-12-20 NOTE — Telephone Encounter (Signed)
The patient indicates that the ultram rx was never called in. I will call the pharmacy.

## 2012-12-20 NOTE — Telephone Encounter (Signed)
I am not interested in writing this prescription.

## 2012-12-20 NOTE — Telephone Encounter (Signed)
We have not prescribed this medication in the past.  Dr Anne Hahn, would you like to write Rx?  Please advise.  Thank you.

## 2012-12-21 ENCOUNTER — Telehealth: Payer: Self-pay | Admitting: Neurology

## 2012-12-21 ENCOUNTER — Telehealth: Payer: Self-pay

## 2012-12-21 ENCOUNTER — Other Ambulatory Visit: Payer: Self-pay | Admitting: Neurology

## 2012-12-21 MED ORDER — KETOROLAC TROMETHAMINE 10 MG PO TABS: 10.0000 mg | ORAL_TABLET | Freq: Four times a day (QID) | ORAL | Status: DC | PRN

## 2012-12-21 NOTE — Telephone Encounter (Signed)
[name] from [employer] called, left message.  She said patient last got Ultram on 06/13 and wants to get it refilled again.   Call back number [phone].   I spoke with Dr Anne Hahn regarding this, and he says patient needs to wait until meds are due.  I called back, spoke with Marylu Lund.  She said the patient actually got the meds on 06/17.  I advised her we have noted on the Rx it must last 28 days, and patient should not get early fills on this medication.  She says they will note the patients file.   I called the patient, got no answer.  Left message.

## 2012-12-21 NOTE — Telephone Encounter (Signed)
I called patient. The patient did not get the Ultram because she was too early on the prescription. I will call in Toradol at this time.

## 2012-12-26 ENCOUNTER — Ambulatory Visit (INDEPENDENT_AMBULATORY_CARE_PROVIDER_SITE_OTHER): Payer: Medicare Other | Admitting: Internal Medicine

## 2012-12-26 VITALS — BP 172/102 | HR 92 | Temp 98.2°F | Resp 20 | Ht 63.0 in | Wt 233.4 lb

## 2012-12-26 DIAGNOSIS — R251 Tremor, unspecified: Secondary | ICD-10-CM

## 2012-12-26 DIAGNOSIS — R259 Unspecified abnormal involuntary movements: Secondary | ICD-10-CM

## 2012-12-26 DIAGNOSIS — M549 Dorsalgia, unspecified: Secondary | ICD-10-CM

## 2012-12-26 DIAGNOSIS — N390 Urinary tract infection, site not specified: Secondary | ICD-10-CM

## 2012-12-26 DIAGNOSIS — R300 Dysuria: Secondary | ICD-10-CM

## 2012-12-26 DIAGNOSIS — R350 Frequency of micturition: Secondary | ICD-10-CM

## 2012-12-26 LAB — POCT URINALYSIS DIPSTICK
Bilirubin, UA: NEGATIVE
Glucose, UA: NEGATIVE
Nitrite, UA: NEGATIVE
Urobilinogen, UA: 0.2

## 2012-12-26 LAB — POCT UA - MICROSCOPIC ONLY
Casts, Ur, LPF, POC: NEGATIVE
Yeast, UA: NEGATIVE

## 2012-12-26 LAB — POCT CBC
Granulocyte percent: 65.8 %G (ref 37–80)
HCT, POC: 38.7 % (ref 37.7–47.9)
Hemoglobin: 12 g/dL — AB (ref 12.2–16.2)
Lymph, poc: 2 (ref 0.6–3.4)
MCHC: 31 g/dL — AB (ref 31.8–35.4)
MCV: 98.5 fL — AB (ref 80–97)
POC Granulocyte: 4.9 (ref 2–6.9)
POC LYMPH PERCENT: 26.6 %L (ref 10–50)

## 2012-12-26 MED ORDER — PHENAZOPYRIDINE HCL 200 MG PO TABS: 200.0000 mg | ORAL_TABLET | Freq: Three times a day (TID) | ORAL | Status: DC | PRN

## 2012-12-26 MED ORDER — CEFTRIAXONE SODIUM 1 G IJ SOLR
1.0000 g | Freq: Once | INTRAMUSCULAR | Status: AC
  Administered 2012-12-26: 1 g via INTRAMUSCULAR

## 2012-12-26 MED ORDER — TRAMADOL HCL 50 MG PO TABS: 50.0000 mg | ORAL_TABLET | Freq: Three times a day (TID) | ORAL | Status: DC | PRN

## 2012-12-26 MED ORDER — CIPROFLOXACIN HCL 500 MG PO TABS: 500.0000 mg | ORAL_TABLET | Freq: Two times a day (BID) | ORAL | Status: DC

## 2012-12-26 NOTE — Progress Notes (Signed)
  Subjective:    Patient ID: Darlene Parker, female    DOB: 10/05/1935, 77 y.o.   MRN: [id number]  HPI Frequency , urgency, flank pain, dysuria 2 weeks.   Review of Systems     Objective:   Physical Exam  Constitutional: She is oriented to person, place, and time. She appears well-nourished.  Eyes: EOM are normal.  Pulmonary/Chest: Effort normal.  Neurological: She is alert and oriented to person, place, and time. She displays tremor. No cranial nerve deficit or sensory deficit. She exhibits normal muscle tone. Coordination and gait normal.      CS urine     Assessment & Plan:  UTI/Flank pain Rocephin 1g/Cipro 500mg  bid/Pyridium prn

## 2012-12-26 NOTE — Progress Notes (Signed)
  Subjective:    Patient ID: Darlene Parker, female    DOB: 1935-11-02, 77 y.o.   MRN: [id number]  HPI    Review of Systems     Objective:   Physical Exam        Assessment & Plan:   Results for orders placed in visit on 12/26/12  POCT UA - MICROSCOPIC ONLY      Result Value Range   WBC, Ur, HPF, POC 20-30     RBC, urine, microscopic 1-3     Bacteria, U Microscopic 2+     Mucus, UA trace     Epithelial cells, urine per micros 2-4     Crystals, Ur, HPF, POC neg     Casts, Ur, LPF, POC neg     Yeast, UA neg    POCT URINALYSIS DIPSTICK      Result Value Range   Color, UA yellow     Clarity, UA cloudy     Glucose, UA neg     Bilirubin, UA neg     Ketones, UA neg     Spec Grav, UA 1.015     Blood, UA trace     pH, UA 5.5     Protein, UA neg     Urobilinogen, UA 0.2     Nitrite, UA neg     Leukocytes, UA moderate (2+)    POCT CBC      Result Value Range   WBC 7.5  4.6 - 10.2 K/uL   Lymph, poc 2.0  0.6 - 3.4   POC LYMPH PERCENT 26.6  10 - 50 %L   MID (cbc) 0.6  0 - 0.9   POC MID % 7.6  0 - 12 %M   POC Granulocyte 4.9  2 - 6.9   Granulocyte percent 65.8  37 - 80 %G   RBC 3.93 (*) 4.04 - 5.48 M/uL   Hemoglobin 12.0 (*) 12.2 - 16.2 g/dL   HCT, POC 40.9  81.1 - 47.9 %   MCV 98.5 (*) 80 - 97 fL   MCH, POC 30.5  27 - 31.2 pg   MCHC 31.0 (*) 31.8 - 35.4 g/dL   RDW, POC 91.4     Platelet Count, POC 209  142 - 424 K/uL   MPV 8.4  0 - 99.8 fL

## 2012-12-26 NOTE — Patient Instructions (Addendum)
Urinary Tract Infection  Urinary tract infections (UTIs) can develop anywhere along your urinary tract. Your urinary tract is your body's drainage system for removing wastes and extra water. Your urinary tract includes two kidneys, two ureters, a bladder, and a urethra. Your kidneys are a pair of bean-shaped organs. Each kidney is about the size of your fist. They are located below your ribs, one on each side of your spine.  CAUSES  Infections are caused by microbes, which are microscopic organisms, including fungi, viruses, and bacteria. These organisms are so small that they can only be seen through a microscope. Bacteria are the microbes that most commonly cause UTIs.  SYMPTOMS   Symptoms of UTIs may vary by age and gender of the patient and by the location of the infection. Symptoms in young women typically include a frequent and intense urge to urinate and a painful, burning feeling in the bladder or urethra during urination. Older women and men are more likely to be tired, shaky, and weak and have muscle aches and abdominal pain. A fever may mean the infection is in your kidneys. Other symptoms of a kidney infection include pain in your back or sides below the ribs, nausea, and vomiting.  DIAGNOSIS  To diagnose a UTI, your caregiver will ask you about your symptoms. Your caregiver also will ask to provide a urine sample. The urine sample will be tested for bacteria and white blood cells. White blood cells are made by your body to help fight infection.  TREATMENT   Typically, UTIs can be treated with medication. Because most UTIs are caused by a bacterial infection, they usually can be treated with the use of antibiotics. The choice of antibiotic and length of treatment depend on your symptoms and the type of bacteria causing your infection.  HOME CARE INSTRUCTIONS   If you were prescribed antibiotics, take them exactly as your caregiver instructs you. Finish the medication even if you feel better after you  have only taken some of the medication.   Drink enough water and fluids to keep your urine clear or pale yellow.   Avoid caffeine, tea, and carbonated beverages. They tend to irritate your bladder.   Empty your bladder often. Avoid holding urine for long periods of time.   Empty your bladder before and after sexual intercourse.   After a bowel movement, women should cleanse from front to back. Use each tissue only once.  SEEK MEDICAL CARE IF:    You have back pain.   You develop a fever.   Your symptoms do not begin to resolve within 3 days.  SEEK IMMEDIATE MEDICAL CARE IF:    You have severe back pain or lower abdominal pain.   You develop chills.   You have nausea or vomiting.   You have continued burning or discomfort with urination.  MAKE SURE YOU:    Understand these instructions.   Will watch your condition.   Will get help right away if you are not doing well or get worse.  Document Released: 03/17/2005 Document Revised: 12/07/2011 Document Reviewed: 07/16/2011  [employer] Patient Information 2014 [employer].

## 2012-12-27 ENCOUNTER — Other Ambulatory Visit: Payer: Self-pay | Admitting: Radiology

## 2012-12-27 ENCOUNTER — Telehealth: Payer: Self-pay | Admitting: Radiology

## 2012-12-27 ENCOUNTER — Telehealth: Payer: Self-pay | Admitting: Neurology

## 2012-12-27 NOTE — Telephone Encounter (Signed)
Patient gets Tramadol on schedule from Dr Anne Hahn. Patient is not due for refill for 28 days. Pharmacy called questioning the Rx. Advised to cancel yours, and she can refill on schedule for Dr Anne Hahn.

## 2012-12-27 NOTE — Telephone Encounter (Signed)
Patient gets Tramadol from Dr Anne Hahn

## 2012-12-27 NOTE — Telephone Encounter (Signed)
[name] from [facility] [address] called, left message.  She wanted to let us know the patient went to Urgent Care last night and asked the Dr to write her an Rx for Tramadol, which he did for #30.  They would like to know if Dr Anne Hahn is okay with the patient getting that Rx since it is too soon for her Rx on file from [place], which was last filled 06/17, and should last 28 days.  Call back number [phone].  Please advise.  Thank you.

## 2012-12-27 NOTE — Telephone Encounter (Signed)
I called the pharmacy. Dr. Perrin Maltese gave the patient another prescription for Ultram after the patient was aware that her Ultram request was too soon. I have asked the pharmacy not to fill this prescription. They will contact Dr. Perrin Maltese to retract the prescription.

## 2012-12-28 ENCOUNTER — Telehealth: Payer: Self-pay

## 2012-12-28 ENCOUNTER — Telehealth: Payer: Self-pay | Admitting: Radiology

## 2012-12-28 NOTE — Telephone Encounter (Signed)
Patients rx for tramadol was cancelled, she should only get this from Dr Anne Hahn. We are not accepting new medicare patients, she can come to urgent care if needed, but we can not give her tramadol. Called her to advise.

## 2012-12-28 NOTE — Telephone Encounter (Signed)
Patient has some questions about a medication for back pain. Patient also has questions about becoming an established Medicare patient. Patient was difficult to understand; got as much information as possible.  [phone]

## 2012-12-28 NOTE — Telephone Encounter (Signed)
Duplicate chart/ error. amy

## 2012-12-28 NOTE — Telephone Encounter (Signed)
Patient advised Dr Perrin Maltese can not fill her Tramadol she must get this from Dr Anne Hahn only. She is asking for sleeping meds.

## 2012-12-29 LAB — URINE CULTURE: Colony Count: >=100000

## 2012-12-29 NOTE — Telephone Encounter (Signed)
No narcotics from me

## 2013-01-01 NOTE — Telephone Encounter (Signed)
Patient has been advised, meds from Dr Anne Hahn only

## 2013-01-02 ENCOUNTER — Telehealth: Payer: Self-pay | Admitting: Neurology

## 2013-01-02 MED ORDER — TRAMADOL HCL ER 200 MG PO TB24: 200.0000 mg | ORAL_TABLET | Freq: Every day | ORAL | Status: DC

## 2013-01-02 NOTE — Telephone Encounter (Signed)
Pt called states she wants to know why she can't get her medicine correctly through the pharmacy, she kept talking and I advised her that I would have someone to return her call. Thanks

## 2013-01-02 NOTE — Telephone Encounter (Signed)
I called patient. The patient is having ongoing chronic low back pain. In the past, she has been seen by neurosurgery, and he did not feel surgery would be beneficial. The patient has had one epidural steroid injection in 2012, but this was not helpful. I would recommend trying this again. The patient has not been seen since December 2013, I'll get a revisit set up for her.

## 2013-01-02 NOTE — Telephone Encounter (Signed)
I called and spoke to the patient for quite some time.  I advised her we will not be authorizing early refills on Ultram.  She asked me why and I told her this is a controlled substance medication, and she should be taking it as prescribed and only IF needed.  Says she takes them every 4 hours because that's how she needs it, and it's not her problem the med is controlled.  Said she wanted the Rx changed to at least #120 per month or wants something stronger for her pain.  Says she takes all 60 pills prescribed each month within two weeks or less due to pain.  Stated "Dr Anne Hahn has no right to tell her how much medication she should take and when she should take it because it's her body and not his".  I responded by saying he is the prescriber and writes Rx's as he deems necessary for the patients condition.  She then said he has no right to only give her #60 when he knows she needs more than that.  She no longer wanted to speak to me, she wanted to speak to [facility].  I advised her Jasmine December was not available.  She said she wants MD to call her back to discuss her pain meds.  Dr Anne Hahn, please advise.  Thank you.

## 2013-01-03 ENCOUNTER — Telehealth: Payer: Self-pay | Admitting: Neurology

## 2013-01-03 NOTE — Telephone Encounter (Signed)
Looks like she got ultram called in and now stating cost a factor.  Please advise.  Pain management?

## 2013-01-03 NOTE — Telephone Encounter (Signed)
I called the patient. The patient indicates that the Ultram extended-release is too expensive. We will go back to using a short acting Ultram. The patient takes 4 tablets daily, next refill we will give her 120 tablets.

## 2013-01-17 ENCOUNTER — Telehealth: Payer: Self-pay | Admitting: Neurology

## 2013-01-17 MED ORDER — TRAMADOL HCL 50 MG PO TABS: 50.0000 mg | ORAL_TABLET | Freq: Four times a day (QID) | ORAL | Status: DC | PRN

## 2013-01-17 NOTE — Telephone Encounter (Signed)
As discussed previously with the patient, I will increase the number of tablets of Ultram given per month to 120. This must last 28 days.

## 2013-01-17 NOTE — Telephone Encounter (Signed)
Patient requesting a new Rx for Tramadol for increased dose.  Please advise.  Thank you.

## 2013-02-01 ENCOUNTER — Telehealth: Payer: Self-pay | Admitting: Neurology

## 2013-02-01 NOTE — Telephone Encounter (Signed)
Pt needs Dr. Anne Hahn to call her about referring her out to have an MRI done. Thanks

## 2013-02-05 NOTE — Telephone Encounter (Signed)
Left vm relaying the patient's appointment information for 02/21/13 with [name] stating that a decision to refer her for an MRI may be made at that time.  Darlene Parker has no record in [facility] of previously being seen by Dr Anne Hahn or [name].

## 2013-02-09 ENCOUNTER — Other Ambulatory Visit: Payer: Self-pay | Admitting: Neurology

## 2013-02-09 ENCOUNTER — Telehealth: Payer: Self-pay | Admitting: Nurse Practitioner

## 2013-02-09 NOTE — Telephone Encounter (Signed)
[facility] has also contacted [place] saying the patient has requested we sent the following Rx's to them for 90 days: Zolpidem, Gabapentin, Lisinopril, Mirtazapine, Simvastatin, Phenazopyridine and Tramadol.  Would you like to prescribe all of the requested medications?  Please advise.  Thank you.

## 2013-02-09 NOTE — Telephone Encounter (Signed)
I called patient. The patient basically wants me to review all of her medication for 90 day prescriptions. The patient already has contact with urgent medical care, Dr. Perrin Maltese. I am not her primary care physician, and I have urged her to contact that office. The patient also wants an MRI of the brain for some reason. The patient will be seen to this office in the next several weeks. We will discuss this issue at that time.

## 2013-02-15 ENCOUNTER — Other Ambulatory Visit: Payer: Self-pay | Admitting: Neurology

## 2013-02-16 ENCOUNTER — Other Ambulatory Visit: Payer: Self-pay | Admitting: Neurology

## 2013-02-16 MED ORDER — TRAMADOL HCL 50 MG PO TABS: 50.0000 mg | ORAL_TABLET | Freq: Four times a day (QID) | ORAL | Status: DC | PRN

## 2013-02-16 NOTE — Telephone Encounter (Signed)
#  120 + 1 refill was sent on 07/30- Each fill must last 28 days.

## 2013-02-16 NOTE — Telephone Encounter (Signed)
Patient is requesting a new Rx for Tramadol because the pharmacy voided her old Rx.

## 2013-02-16 NOTE — Telephone Encounter (Signed)
Rx signed and faxed.

## 2013-02-21 ENCOUNTER — Encounter: Payer: Self-pay | Admitting: Nurse Practitioner

## 2013-02-21 ENCOUNTER — Ambulatory Visit (INDEPENDENT_AMBULATORY_CARE_PROVIDER_SITE_OTHER): Payer: Medicare PPO | Admitting: Nurse Practitioner

## 2013-02-21 VITALS — BP 123/81 | HR 90 | Ht 64.5 in | Wt 238.0 lb

## 2013-02-21 DIAGNOSIS — R251 Tremor, unspecified: Secondary | ICD-10-CM

## 2013-02-21 DIAGNOSIS — G8929 Other chronic pain: Secondary | ICD-10-CM

## 2013-02-21 DIAGNOSIS — R259 Unspecified abnormal involuntary movements: Secondary | ICD-10-CM

## 2013-02-21 DIAGNOSIS — M549 Dorsalgia, unspecified: Secondary | ICD-10-CM

## 2013-02-21 MED ORDER — NITROGLYCERIN 0.4 MG SL SUBL: 0.4000 mg | SUBLINGUAL_TABLET | SUBLINGUAL | Status: DC | PRN

## 2013-02-21 MED ORDER — GABAPENTIN 300 MG PO CAPS: 300.0000 mg | ORAL_CAPSULE | Freq: Three times a day (TID) | ORAL | Status: DC

## 2013-02-21 MED ORDER — CLONAZEPAM 0.5 MG PO TABS: 0.5000 mg | ORAL_TABLET | Freq: Two times a day (BID) | ORAL | Status: DC

## 2013-02-21 MED ORDER — SIMVASTATIN 20 MG PO TABS: 20.0000 mg | ORAL_TABLET | Freq: Every evening | ORAL | Status: DC

## 2013-02-21 MED ORDER — LISINOPRIL 20 MG PO TABS: 20.0000 mg | ORAL_TABLET | Freq: Every day | ORAL | Status: DC

## 2013-02-21 NOTE — Progress Notes (Signed)
I have read the note, and I agree with the clinical assessment and plan.  Estreya Clay KEITH   

## 2013-02-21 NOTE — Progress Notes (Signed)
Reason for visit  followup for tremor HPI: 77 year old right-handed white female with a history of an essential tremor. She was last seen by Dr. Anne Hahn 06/05/12. The patient has chronic low back pain and leg discomfort, [name], [name]  The patient indicates that she has some sort of a left chest mass that requires evaluation. The patient was told she has borderline diabetes, and that there is some indication that she may have bipolar disorder, [name] The patient has undergone nerve conduction study evaluation previously that did not show evidence of a peripheral neuropathy, but she does complain of some burning sensations in the feet at night, and some discomfort.  The patient continues to have tremor, but she has come off of all of her medications. The patient returns for an evaluation.  02/21/13: Patient returns for followup at her last visit with Dr. Anne Hahn 06/05/2012. In the interim she has stopped her medication for tremor. She is no longer seeing her primary care as Dr. Hyacinth Meeker found out she was getting medications from multiple providers. She is wanting [place] to take on primary care responsibility. She is currently residing in an assisted living facility. Daughter is with her today and says she is supposed to be getting a primary care within the next couple of months according to her.      Review of Systems  Out of a complete 14 system review, the patient complains of only the following symptoms, and all other reviewed systems are negative.  Constitutional: Weight gain      Endocrine:   Increased thirst         Genitourinary: Urination problems   Incontinence   Musculoskeletal: Joint pain     Aching muscles   Neurological memory loss, headache, weakness, tremor Psychiatric depression, anxiety, decreased  energy Sleep insomnia, restless legs  Social History  The patient is not married, and the patient currently is in an independent living apartment. The patient is retired. She has a Materials engineer. The patient has 3 children who are alive and well.  The patient quit smoking around 1980. The patient drinks alcohol on occasion. The patient states no known drug allergies.  Inhaled Tobacco Use: former smoker  Family History  The mother died at age 70 with cancer of the uterus. The medical history regarding the father is unknown. The patient has one brother and 2 sisters. One brother died of heart disease, and one sister died with heart disease. One sister is still living, with a history of stroke and emphysema. The daughter has a mild tremor.   Past Medical History  One. Essential tremor 2. Obesity 3. Hypertension 4. Degenerative arthritis 5. Depression and anxiety 6. Chronic low back pain 7. Coronary artery disease, angina 8. Bilateral lumbosacral radiculopathies  Surgical History  1. Gallbladder resection 2. Appendectomy  Medications Current Outpatient Prescriptions on File Prior to Visit  Medication Sig Dispense Refill  . aspirin EC 325 MG tablet Take 1 tablet (325 mg total) by mouth daily.  14 tablet  0  . ciprofloxacin (CIPRO) 500 MG tablet Take 1 tablet (500 mg total) by mouth 2 (two) times daily.  20 tablet  0  . gabapentin (NEURONTIN) 300 MG capsule Take 300 mg by mouth 3 (three) times daily.      Marland Kitchen  ketorolac (TORADOL) 10 MG tablet Take 1 tablet (10 mg total) by mouth every 6 (six) hours as needed for pain.  20 tablet  0  . lisinopril (PRINIVIL,ZESTRIL) 20 MG tablet Take 20 mg by mouth daily.      . nitroGLYCERIN (NITROSTAT) 0.4 MG SL tablet Place 0.4 mg under the tongue every 5 (five) minutes as needed.        . simvastatin (ZOCOR) 20 MG tablet Take 20 mg by mouth every evening.      . traMADol (ULTRAM) 50 MG tablet Take 1 tablet (50 mg total) by mouth every 6 (six) hours  as needed for pain (Must last 28 days.).  120 tablet  1  . mirtazapine (REMERON) 15 MG tablet Take 15 mg by mouth at bedtime.       No current facility-administered medications on file prior to visit.    Allergies No Known Allergies  Physical Exam General: well developed, morbidly obese  seated, in no evident distress Neurologic Exam Mental Status: Awake and fully alert. Oriented to place and time. Follows all commands. Speech and language normal.  Vocal  tremor present Cranial Nerves:  Pupils equal, briskly reactive to light. Extraocular movements full without nystagmus. Visual fields full to confrontation. Hearing intact and symmetric to finger snap. Facial sensation intact. Face, tongue, palate move normally and symmetrically. Neck flexion and extension normal.  Motor: Normal bulk and tone. Normal strength in all tested extremity muscles.No focal weakness Coordination: Rapid alternating movements normal in all extremities. Finger-to-nose and heel-to-shin performed accurately bilaterally. Tremor bilaterally with finger to nose to finger  Gait and Station: Arises from chair without difficulty. Stance is wide-based .  Able to heel, toe and unsteady with tandem walk.  No assistive device Reflexes: 2+ and symmetric. Toes downgoing.     ASSESSMENT: Benign essential tremor currently off meds History of chronic low back pain and gait disorder. Patient does not have primary care at present     PLAN: Clonazepam 0.5 mg twice daily will order take for tremor Continue gabapentin at current dose Will refill other medications for one month until primary care is available, patient and daughter made aware Continue tramadol when necessary for pain Followup in 3 months  Nilda Riggs, GNP-BC APRN

## 2013-02-21 NOTE — Patient Instructions (Addendum)
Clonazepam 0.5 mg twice daily will order take for tremor Continue gabapentin and current dose Continue tramadol when necessary for pain Followup in 3 months

## 2013-05-02 ENCOUNTER — Other Ambulatory Visit: Payer: Self-pay | Admitting: Nurse Practitioner

## 2013-05-02 ENCOUNTER — Other Ambulatory Visit: Payer: Self-pay | Admitting: Neurology

## 2013-05-03 ENCOUNTER — Other Ambulatory Visit: Payer: Self-pay | Admitting: Neurology

## 2013-05-03 ENCOUNTER — Other Ambulatory Visit: Payer: Self-pay

## 2013-05-03 MED ORDER — TRAMADOL HCL 50 MG PO TABS: 50.0000 mg | ORAL_TABLET | Freq: Four times a day (QID) | ORAL | Status: DC | PRN

## 2013-05-03 NOTE — Telephone Encounter (Signed)
Pt's [facility] at [phone]. °

## 2013-05-23 ENCOUNTER — Institutional Professional Consult (permissible substitution): Payer: Medicare HMO | Admitting: Pulmonary Disease

## 2013-05-28 ENCOUNTER — Other Ambulatory Visit: Payer: Self-pay | Admitting: Nurse Practitioner

## 2013-05-28 ENCOUNTER — Ambulatory Visit: Payer: Medicare PPO | Admitting: Nurse Practitioner

## 2013-05-28 ENCOUNTER — Ambulatory Visit (INDEPENDENT_AMBULATORY_CARE_PROVIDER_SITE_OTHER): Payer: Medicare PPO | Admitting: Nurse Practitioner

## 2013-05-28 ENCOUNTER — Encounter (INDEPENDENT_AMBULATORY_CARE_PROVIDER_SITE_OTHER): Payer: Self-pay

## 2013-05-28 ENCOUNTER — Encounter: Payer: Self-pay | Admitting: Nurse Practitioner

## 2013-05-28 VITALS — BP 134/76 | HR 89 | Ht 65.0 in | Wt 228.0 lb

## 2013-05-28 DIAGNOSIS — G8929 Other chronic pain: Secondary | ICD-10-CM

## 2013-05-28 DIAGNOSIS — R251 Tremor, unspecified: Secondary | ICD-10-CM

## 2013-05-28 DIAGNOSIS — R259 Unspecified abnormal involuntary movements: Secondary | ICD-10-CM

## 2013-05-28 DIAGNOSIS — M549 Dorsalgia, unspecified: Secondary | ICD-10-CM

## 2013-05-28 MED ORDER — CLONAZEPAM 0.5 MG PO TABS: 0.5000 mg | ORAL_TABLET | Freq: Two times a day (BID) | ORAL | Status: DC

## 2013-05-28 MED ORDER — MIRTAZAPINE 15 MG PO TABS: 15.0000 mg | ORAL_TABLET | Freq: Every day | ORAL | Status: DC

## 2013-05-28 MED ORDER — GABAPENTIN 300 MG PO CAPS: 300.0000 mg | ORAL_CAPSULE | Freq: Three times a day (TID) | ORAL | Status: DC

## 2013-05-28 NOTE — Patient Instructions (Signed)
Will renew Klonopin and  Neurotin.  Mirtazapine for 1 month only Need to get a PCP

## 2013-05-28 NOTE — Progress Notes (Signed)
I have read the note, and I agree with the clinical assessment and plan.  Leanna Hamid KEITH   

## 2013-05-28 NOTE — Progress Notes (Signed)
[facility]  Darlene Parker: Darlene Parker DOB: 07-Dec-1935   REASON FOR VISIT: for tremor, history of back pain   HISTORY OF PRESENT ILLNESS: Darlene Parker, 77 year old white female returns for followup. She has a history of essential tremor for which she is on clonazepam. She also has a history of bipolar disorder. She has a history of back pain however she has been seen by [facility] orthopedics in the past  for epidural steroids without benefit most recently in 2012. She currently resides in an assisted living [facility]. Her meals are provided however she has to get her medications. She has not had a primary care physician since she moved to [facility] and she has been encouraged to get one at each office visit. [name] is a poor historian and has difficulty telling me when she takes her medications and what she takes. She is alone today at her visit having been dropped off.Will have to clarify with pharmacy.  HISTORY:  of an essential tremor.  [name], [name], [name] [name] [name], and that there is some indication that she may have bipolar disorder, [name] [name] has undergone nerve conduction study evaluation previously that did not show evidence of a peripheral neuropathy, but she does complain of some burning sensations in the feet at night, and some discomfort. [name], but she has come off of all of her medications. [name] returns for an evaluation.  02/21/13: Darlene Parker returns for followup at her last visit with Dr. Anne Hahn 06/05/2012. In the interim she has stopped  her medication for tremor. She is no longer seeing her primary care as Dr. Hyacinth Meeker found out she was getting medications from multiple providers. She is wanting [place] to take on primary care responsibility. She is currently residing in an assisted living facility. Daughter is with her today and says she is supposed to be getting a primary care within the next couple of months according to her.     REVIEW OF SYSTEMS: Full 14 system review of systems performed and notable only for those listed, all others are neg:  Constitutional: fatigue Cardiovascular: N/A  Ear/Nose/Throat: N/A  Skin: N/A  Eyes: N/A  Respiratory: N/A  Gastroitestinal: N/A  Hematology/Lymphatic: N/A  Endocrine: excessive thirst Musculoskeletal:N/A  Allergy/Immunology: N/A  Neurological: Weakness, tremors  Psychiatric: N/A   ALLERGIES: No Known Allergies  HOME MEDICATIONS: Outpatient Prescriptions Prior to Visit  Medication Sig Dispense Refill  . aspirin EC 325 MG tablet Take 1 tablet (325 mg total) by mouth daily.  14 tablet  0  . clonazePAM (KLONOPIN) 0.5 MG tablet Take 1 tablet (0.5 mg total) by mouth 2 (two) times daily.  60 tablet  2  . gabapentin (NEURONTIN) 300 MG capsule Take 1 capsule (300 mg total) by mouth 3 (three) times daily.  90 capsule  3  . lisinopril (PRINIVIL,ZESTRIL) 20 MG tablet Take 1 tablet (20 mg total) by mouth daily.  30 tablet  1  . nitroGLYCERIN (NITROSTAT) 0.4 MG SL tablet Place 1 tablet (0.4 mg total) under the tongue every 5 (five) minutes as needed.  30 tablet  0  . simvastatin (ZOCOR) 20 MG tablet TAKE 1 TABLET (20 MG TOTAL) BY MOUTH EVERY EVENING.  30 tablet  5  . traMADol (ULTRAM) 50 MG tablet Take 1 tablet (50 mg total) by mouth every 6 (six) hours as needed.  120 tablet  1   No facility-administered medications prior to visit.    PAST MEDICAL HISTORY: Past Medical History  Diagnosis Date  . Arthritis     RA  . Scoliosis   . Hypertension   . Depression   . Anxiety   .  Osteoporosis   . Movement disorder     PAST SURGICAL HISTORY: Past Surgical History  Procedure Laterality Date  . Appendectomy    . Cholecystectomy      FAMILY HISTORY: No family history on file.  SOCIAL HISTORY: History   Social History  . Marital Status: Widowed    Spouse Name: N/A    Number of Children: 3  . Years of Education: 13   Occupational History  .     Social History Main Topics  . Smoking status: Former Games developer  . Smokeless tobacco: Never Used  . Alcohol Use: Yes  . Drug Use: No  . Sexual Activity: Not on file   Other Topics Concern  . Not on file   Social History Narrative   Darlene Parker is a widow and lives alone.   Darlene Parker has three children.   Darlene Parker is retired.   Darlene Parker has a college education.   Darlene Parker is right handed.   Darlene Parker does drink soda rarely.     PHYSICAL EXAM  Filed Vitals:   05/28/13 1337  BP: 134/76  Pulse: 89  Height: 5\' 5"  (1.651 m)  Weight: 228 lb (103.42 kg)   Body mass index is 37.94 kg/(m^2).  Generalized: Well developed, obese female in no acute distress  Head: normocephalic and atraumatic,. Oropharynx benign  Neck: Supple, no carotid bruits  Cardiac: Regular rate rhythm, no murmur  Neurological examination   Mentation: Alert oriented to time, place, history taking. Follows all commands speech and language fluent. Vocal tremor  Cranial nerve II-XII: Pupils were equal round reactive to light extraocular movements were full, visual field were full on confrontational test. Facial sensation and strength were normal. hearing was intact to finger rubbing bilaterally. Uvula tongue midline. head turning and shoulder shrug were normal and symmetric.Tongue protrusion into cheek strength was normal. Motor: normal bulk and tone, full strength in the BUE, BLE, outstretched hand tremor right greater than left. No focal weakness Coordination: finger-nose-finger, unable to perform  heel-to-shin Reflexes: Brachioradialis 2/2,  biceps 2/2, triceps 2/2, patellar 2/2, Achilles 2/2, plantar responses were flexor bilaterally. Gait and Station: Rising up from seated position without assistance, wide based  stance,  moderate stride, ambulated with a rolling walker  DIAGNOSTIC DATA (LABS, IMAGING, TESTING) - I reviewed Darlene Parker records, labs, notes, testing and imaging myself where available.  Lab Results  Component Value Date   WBC 7.5 12/26/2012   HGB 12.0* 12/26/2012   HCT 38.7 12/26/2012   MCV 98.5* 12/26/2012   PLT 228 11/09/2012      Component Value Date/Time   NA 137 11/09/2012 2230   K 3.5 11/09/2012 2230   CL 100 11/09/2012 2230   CO2 23 11/09/2012 2230   GLUCOSE 150* 11/09/2012 2230   BUN 14 11/09/2012 2230   CREATININE 0.86 11/09/2012 2230   CALCIUM 9.2 11/09/2012 2230   PROT 7.8 10/23/2010 1617   ALBUMIN 3.9 10/23/2010 1617  AST 19 10/23/2010 1617   ALT 15 10/23/2010 1617   ALKPHOS 60 10/23/2010 1617   BILITOT 0.5 10/23/2010 1617   GFRNONAA 65* 11/09/2012 2230   GFRAA 75* 11/09/2012 2230      ASSESSMENT AND PLAN  77 y.o. year old female  has a past medical history of Arthritis; Scoliosis; Hypertension; Depression; Anxiety; Osteoporosis; and Movement disorder. here for follow up. She has questions about her MRI that was done in 2012 and this was explained to her as to why she did not have surgery.  Will renew Klonopin and  Neurontin.  Mirtazapine for 1 month only Tramadol to early to fill Need to get a PCP , I called the pharmacy to see if a PCP had filled any of her meds. She has seen Virginia Rochester several times in the first part of the year and received refills . Vst time Nilda Riggs, [facility], [facility], APRN  [facility] [address], [address] [place], [place] [address] [phone]

## 2013-05-28 NOTE — Telephone Encounter (Signed)
[facility] at [phone].

## 2013-06-01 ENCOUNTER — Other Ambulatory Visit: Payer: Self-pay | Admitting: Nurse Practitioner

## 2013-06-04 ENCOUNTER — Telehealth: Payer: Self-pay | Admitting: Nurse Practitioner

## 2013-06-04 NOTE — Telephone Encounter (Signed)
This is not a medication we typically prescribe.  Looks like we may have done it as a courtesy, but have advised patient multiple times since Dec 2013 that she needs to get a PCP.  Patient states she still does not have one and wants Eber Jones to continue prescribing meds.  Eber Jones, do you want to keep prescribing PCP meds?  Please advise.  Thank you.

## 2013-06-04 NOTE — Telephone Encounter (Signed)
I made patient aware on her appt 12/8 that I would no longer refill meds not prescribed for neurological problems. The pharmacy says her PCP is Sigmund Hazel.

## 2013-06-04 NOTE — Telephone Encounter (Signed)
I spoke with Barbara Cower, he is aware patient will need to request Rx from other provider.

## 2013-06-05 ENCOUNTER — Other Ambulatory Visit: Payer: Self-pay | Admitting: Neurology

## 2013-06-05 NOTE — Telephone Encounter (Signed)
Per Eber Jones patient needs to request from Sigmund Hazel or current PCP

## 2013-06-29 ENCOUNTER — Other Ambulatory Visit: Payer: Self-pay | Admitting: Nurse Practitioner

## 2013-06-29 ENCOUNTER — Telehealth: Payer: Self-pay

## 2013-06-29 NOTE — Telephone Encounter (Signed)
Darlene Parker at 06/29/2013 10:23 AM     Status: Signed        Patient requesting refill of Mirtazapine, patient states she only has one left.   Patient called front desk saying she needs a refill on Mirtazapine.  I called the patient back.  Advised per last OV patient needs to request form PCP.  Patient states no other doctor will see her.  Says every doctor she's had over the past 2 years refuses to see her anymore except [place].  She says we should assign her a PCP.  Advised that is not something we can do.  Says she wants Eber Jones to either refill Mirtazapine or prescribe something else because she needs meds to sleep.  Asked her about MD mentioned in OV note, Sigmund Hazel.   Apparently that provider will no longer see the patient either.  Patient says we have to prescribe something for her since she does not have another MD.  Fredna Dow she wants Eber Jones to call her regarding this.  Please advise.  Thank you.

## 2013-06-29 NOTE — Telephone Encounter (Signed)
Patient requesting refill of Mirtazapine, patient states she only has one left.

## 2013-06-29 NOTE — Telephone Encounter (Signed)
Per Eber Jones patient needs to request from PCP or other provider.  Spoke with daughter who said they plan on having a physician see patient at the facility she lives in.

## 2013-06-29 NOTE — Telephone Encounter (Signed)
I spoke with Darlene Parker.  She asked that I try to contact the son (I spoke with him regarding this last month).  I called Barbara Cower, got no answer.  I called other contact listed, Misty Stanley, which is the daughter.  Explained the situation.  Said she is aware there have been ongoing issues with this.  States her mother is now in a facility and they plan on having a doctor see her there.

## 2013-07-05 ENCOUNTER — Other Ambulatory Visit: Payer: Self-pay | Admitting: Internal Medicine

## 2013-07-05 NOTE — Telephone Encounter (Signed)
Pt called regarding rx refill for a UTI. Pt states she called her pharmacy to request a refill and they sent in a request. Pt has not been seen in our office since July 2014. States she gets these frequently and knows what it is and wants a refill on medicine to help her. Pt was difficult to understand - had trouble speaking.    Please call. Pt very anxious about getting refill asap.   Best: [phone]  bf

## 2013-07-07 ENCOUNTER — Telehealth: Payer: Self-pay

## 2013-07-07 NOTE — Telephone Encounter (Signed)
Patient called to check status on previous message. She was advised she would have to come in to be seen and check her urine. She voiced understanding.

## 2013-07-07 NOTE — Telephone Encounter (Signed)
Patient thinks she has UTI and is unable to get a ride to bring her to the clinic. States she would like some medicine called [facility] if possible. Patient states she had a UTI in July and wants the same medicine.   [phone]

## 2013-07-08 ENCOUNTER — Ambulatory Visit (INDEPENDENT_AMBULATORY_CARE_PROVIDER_SITE_OTHER): Payer: Medicare PPO | Admitting: Family Medicine

## 2013-07-08 ENCOUNTER — Telehealth: Payer: Self-pay

## 2013-07-08 VITALS — BP 128/90 | HR 86 | Temp 98.4°F | Resp 18 | Ht 63.0 in | Wt 234.8 lb

## 2013-07-08 DIAGNOSIS — R350 Frequency of micturition: Secondary | ICD-10-CM

## 2013-07-08 DIAGNOSIS — N390 Urinary tract infection, site not specified: Secondary | ICD-10-CM

## 2013-07-08 DIAGNOSIS — K219 Gastro-esophageal reflux disease without esophagitis: Secondary | ICD-10-CM

## 2013-07-08 DIAGNOSIS — R079 Chest pain, unspecified: Secondary | ICD-10-CM

## 2013-07-08 LAB — POCT CBC
Granulocyte percent: 66.1 %G (ref 37–80)
HCT, POC: 44.3 % (ref 37.7–47.9)
Hemoglobin: 13.9 g/dL (ref 12.2–16.2)
Lymph, poc: 1.7 (ref 0.6–3.4)
MCH, POC: 31.3 pg — AB (ref 27–31.2)
MCHC: 31.4 g/dL — AB (ref 31.8–35.4)
MCV: 99.8 fL — AB (ref 80–97)
MID (cbc): 0.6 (ref 0–0.9)
MPV: 8.2 fL (ref 0–99.8)
PLATELET COUNT, POC: 208 10*3/uL (ref 142–424)
POC GRANULOCYTE: 4.4 (ref 2–6.9)
POC LYMPH %: 25.1 % (ref 10–50)
POC MID %: 8.8 % (ref 0–12)
RBC: 4.44 M/uL (ref 4.04–5.48)
RDW, POC: 13.7 %
WBC: 6.7 10*3/uL (ref 4.6–10.2)

## 2013-07-08 LAB — POCT UA - MICROSCOPIC ONLY
Casts, Ur, LPF, POC: NEGATIVE
Crystals, Ur, HPF, POC: NEGATIVE
Mucus, UA: POSITIVE
Yeast, UA: NEGATIVE

## 2013-07-08 LAB — POCT URINALYSIS DIPSTICK
Bilirubin, UA: NEGATIVE
Glucose, UA: NEGATIVE
Ketones, UA: NEGATIVE
Nitrite, UA: POSITIVE
SPEC GRAV UA: 1.02
UROBILINOGEN UA: 0.2
pH, UA: 5.5

## 2013-07-08 MED ORDER — OMEPRAZOLE 20 MG PO CPDR: 20.0000 mg | DELAYED_RELEASE_CAPSULE | Freq: Every day | ORAL | Status: DC

## 2013-07-08 MED ORDER — CEFTRIAXONE SODIUM 1 G IJ SOLR: 1.0000 g | Freq: Once | INTRAMUSCULAR | Status: DC

## 2013-07-08 MED ORDER — CIPROFLOXACIN HCL 500 MG PO TABS: 500.0000 mg | ORAL_TABLET | Freq: Two times a day (BID) | ORAL | Status: DC

## 2013-07-08 NOTE — Telephone Encounter (Signed)
Patient was seen this morning. States that she was given a prescription for Ciprofloxacin however she states that she wants the medication that she was given the last time she had a bladder infection. Patient also mentioned her Prilosec medication (referred to it as a purple pill) and does not know what it is for. No one was available at the nurse's station to speak with patient. Advised her that someone will return her call to help with her concerns ASAP.  [phone]

## 2013-07-08 NOTE — Progress Notes (Signed)
Subjective:    Patient ID: Darlene Parker, female    DOB: 1935-09-12, 78 y.o.   MRN: [id number]  HPI This chart was scribed for Nilda Simmer by Andrew Au, ED Scribe. This patient was seen in room 12 and the patient's care was started at 3:34 PM.  HPI Comments: Darlene Parker is a 78 y.o. female who presents to the [facility] complaining of dysuria onset 4 days ago. Pt reports that she urinated 2-3 times last night and that she usually never has to go during the night. She denies fever, chills, sweats, nausea, and emesis. She also denies vaginal irritation and itch. She reports back pain but that she always has this pain. She reports that her last UTI was 6 months ago. Pt states that she did not drive herself to the visit and that she took a taxi.   Pt was last seen in July by Dr. Perrin Maltese for UTI.    3:34 PM Pt was seen in ED for CP in 10/2012. She requested a refill of nitroglycerin. Pt has frequent CP at night. Pt does not have a cardiologist. She refused admission in May, but ED  made referral for primary care and cardiology that pt did not go to.   Pt has sharp, non-radiating, intermittent CP onset 12 months. She reports that it occurs at night and sometimes wakes her up.  Pt reports that the pain last for about 2-3 minutes. She denies radiation to her back, arms, and neck. She reports asociated SOB, sweats and clamminess.  She denies nausea. She reports her last episode was last night after eating dinner. She reports that eating does not normally bring on CP. She takes 1-2 nitroglycerin with relief of pain. When she was last seen in the ED she reports that she had an EKG and states it looked fine; pt refused admission. She also has associated heart burn and indigestion in which she takes nothing for.   PCP: patient reports none.  Upon review of chart, neurology has been refilling her antihypertensive and cholesterol medications until she can establish with PCP.  Past  Medical History  Diagnosis Date  . Arthritis     RA  . Scoliosis   . Hypertension   . Depression   . Anxiety   . Osteoporosis   . Movement disorder    No Known Allergies Prior to Admission medications   Medication Sig Start Date End Date Taking? Authorizing Provider  aspirin EC 325 MG tablet Take 1 tablet (325 mg total) by mouth daily. 11/10/12  Yes Joya Gaskins, MD  clonazePAM (KLONOPIN) 0.5 MG tablet Take 1 tablet (0.5 mg total) by mouth 2 (two) times daily. 05/28/13  Yes Nilda Riggs, NP  gabapentin (NEURONTIN) 300 MG capsule Take 1 capsule (300 mg total) by mouth 3 (three) times daily. 05/28/13  Yes Nilda Riggs, NP  lisinopril (PRINIVIL,ZESTRIL) 20 MG tablet Take 1 tablet (20 mg total) by mouth daily. 02/21/13  Yes Nilda Riggs, NP  mirtazapine (REMERON) 15 MG tablet Take 1 tablet (15 mg total) by mouth at bedtime. Will refill for one month. Pt needs to see PCP Sigmund Hazel 05/28/13  Yes Nilda Riggs, NP  simvastatin (ZOCOR) 20 MG tablet TAKE 1 TABLET (20 MG TOTAL) BY MOUTH EVERY EVENING. 05/02/13  Yes York Spaniel, MD  traMADol (ULTRAM) 50 MG tablet Take 1 tablet (50 mg total) by mouth every 6 (six) hours as needed. 05/03/13  Yes Hennie Duos  Anne Hahn, MD  nitroGLYCERIN (NITROSTAT) 0.4 MG SL tablet Place 1 tablet (0.4 mg total) under the tongue every 5 (five) minutes as needed. 02/21/13   Nilda Riggs, NP   Review of Systems  Constitutional: Positive for diaphoresis. Negative for chills and fatigue.  Respiratory: Positive for shortness of breath. Negative for cough, choking and wheezing.   Cardiovascular: Positive for chest pain.  Gastrointestinal: Negative for nausea, vomiting, abdominal pain and diarrhea.  Genitourinary: Positive for dysuria and urgency. Negative for frequency, hematuria, flank pain, decreased urine volume, vaginal bleeding, vaginal discharge, difficulty urinating and vaginal pain.  Musculoskeletal: Positive for back pain.    Past Medical History  Diagnosis Date  . Arthritis     RA  . Scoliosis   . Hypertension   . Depression   . Anxiety   . Osteoporosis   . Movement disorder    Past Surgical History  Procedure Laterality Date  . Appendectomy    . Cholecystectomy     No Known Allergies Current Outpatient Prescriptions on File Prior to Visit  Medication Sig Dispense Refill  . aspirin EC 325 MG tablet Take 1 tablet (325 mg total) by mouth daily.  14 tablet  0  . clonazePAM (KLONOPIN) 0.5 MG tablet Take 1 tablet (0.5 mg total) by mouth 2 (two) times daily.  60 tablet  5  . gabapentin (NEURONTIN) 300 MG capsule Take 1 capsule (300 mg total) by mouth 3 (three) times daily.  90 capsule  6  . lisinopril (PRINIVIL,ZESTRIL) 20 MG tablet Take 1 tablet (20 mg total) by mouth daily.  30 tablet  1  . mirtazapine (REMERON) 15 MG tablet Take 1 tablet (15 mg total) by mouth at bedtime. Will refill for one month. Pt needs to see PCP Sigmund Hazel  30 tablet  0  . simvastatin (ZOCOR) 20 MG tablet TAKE 1 TABLET (20 MG TOTAL) BY MOUTH EVERY EVENING.  30 tablet  5  . traMADol (ULTRAM) 50 MG tablet Take 1 tablet (50 mg total) by mouth every 6 (six) hours as needed.  120 tablet  1  . nitroGLYCERIN (NITROSTAT) 0.4 MG SL tablet Place 1 tablet (0.4 mg total) under the tongue every 5 (five) minutes as needed.  30 tablet  0   No current facility-administered medications on file prior to visit.   History   Social History  . Marital Status: Widowed    Spouse Name: N/A    Number of Children: 3  . Years of Education: 13   Occupational History  .     Social History Main Topics  . Smoking status: Former Games developer  . Smokeless tobacco: Never Used  . Alcohol Use: Yes  . Drug Use: No  . Sexual Activity: Not on file   Other Topics Concern  . Not on file   Social History Narrative   Patient is a widow and lives alone.   Patient has three children.   Patient is retired.   Patient has a college education.   Patient is right  handed.   Patient does drink soda rarely.       Objective:   Physical Exam  Nursing note and vitals reviewed. Constitutional: She is oriented to person, place, and time. She appears well-developed and well-nourished. No distress.  HENT:  Head: Normocephalic and atraumatic.  Eyes: Conjunctivae and EOM are normal. Pupils are equal, round, and reactive to light.  Neck: Neck supple.  Cardiovascular: Normal rate, regular rhythm and normal heart sounds.   Pulmonary/Chest: Effort  normal and breath sounds normal. No respiratory distress. She has no wheezes. She has no rales.  Abdominal: Soft. Bowel sounds are normal. She exhibits no distension and no mass. There is tenderness in the right lower quadrant, suprapubic area and left lower quadrant. There is no rebound, no guarding and no CVA tenderness.     Neurological: She is alert and oriented to person, place, and time.  +head tremor.  Skin: Skin is warm and dry.  Psychiatric: She has a normal mood and affect. Her behavior is normal.   Filed Vitals:   07/08/13 1023  BP: 128/90  Pulse: 86  Temp: 98.4 F (36.9 C)  TempSrc: Oral  Resp: 18  Height: 5\' 3"  (1.6 m)  Weight: 234 lb 12.8 oz (106.505 kg)  SpO2: 98%   Results for orders placed in visit on 07/08/13  URINE CULTURE      Result Value Range   Colony Count >=100,000 COLONIES/ML     Preliminary Report ESCHERICHIA COLI    POCT UA - MICROSCOPIC ONLY      Result Value Range   WBC, Ur, HPF, POC TNTC     RBC, urine, microscopic TNTC     Bacteria, U Microscopic 3+     Mucus, UA positive     Epithelial cells, urine per micros 2-3     Crystals, Ur, HPF, POC negative     Casts, Ur, LPF, POC negative     Yeast, UA negative    POCT URINALYSIS DIPSTICK      Result Value Range   Color, UA yellow     Clarity, UA cloudy     Glucose, UA neg     Bilirubin, UA neg     Ketones, UA neg     Spec Grav, UA 1.020     Blood, UA small     pH, UA 5.5     Protein, UA trace     Urobilinogen,  UA 0.2     Nitrite, UA positive     Leukocytes, UA large (3+)    POCT CBC      Result Value Range   WBC 6.7  4.6 - 10.2 K/uL   Lymph, poc 1.7  0.6 - 3.4   POC LYMPH PERCENT 25.1  10 - 50 %L   MID (cbc) 0.6  0 - 0.9   POC MID % 8.8  0 - 12 %M   POC Granulocyte 4.4  2 - 6.9   Granulocyte percent 66.1  37 - 80 %G   RBC 4.44  4.04 - 5.48 M/uL   Hemoglobin 13.9  12.2 - 16.2 g/dL   HCT, POC 07/10/13  12.2 - 47.9 %   MCV 99.8 (*) 80 - 97 fL   MCH, POC 31.3 (*) 27 - 31.2 pg   MCHC 31.4 (*) 31.8 - 35.4 g/dL   RDW, POC 48.2     Platelet Count, POC 208  142 - 424 K/uL   MPV 8.2  0 - 99.8 fL   EKG: patient refused.     Assessment & Plan:   1. Urination frequency   2. UTI (urinary tract infection)   3. Chest pain   4. GERD (gastroesophageal reflux disease)    1. UTI: New.  Rx for Cipro provided; send urine culture; encourage aggressive fluid intake.  RTC for fever, vomiting, worsening.  Pt initially requesting Rocephin injection but then refused; then requested injection and then refused it again. 2.  Chest pain:  Persistent; atypical.  S/p  ED visit 10/2012 and refused admission. EKG stable; cardiac enzymes negative at that time.  CXR normal overall.  Pt refusing EKG at visit today but agreeable to referral to cardiology. Cardiac risk factors include age, HTN, hyperlipidemia.  3.  GERD:  New.  Associated with nighttime chest pain; treat with Prilosec 20mg  daily for one month; dietary modification encouraged.  Meds ordered this encounter  Medications  . DISCONTD: cefTRIAXone (ROCEPHIN) injection 1 g    Sig:   . ciprofloxacin (CIPRO) 500 MG tablet    Sig: Take 1 tablet (500 mg total) by mouth 2 (two) times daily.    Dispense:  14 tablet    Refill:  0  . omeprazole (PRILOSEC) 20 MG capsule    Sig: Take 1 capsule (20 mg total) by mouth daily.    Dispense:  30 capsule    Refill:  5    For heartburn/indigestion  . DISCONTD: cefTRIAXone (ROCEPHIN) injection 1 g    Sig:     I personally  performed the services described in this documentation, which was scribed in my presence.  The recorded information has been reviewed and is accurate.  Nilda SimmerKristi Marquise Wicke, M.D.  [facility]  [facility] [address] [place], [place]  [id number] [phone] phone [phone] fax

## 2013-07-09 NOTE — Telephone Encounter (Signed)
Call patient --- 1.  Ciprofloxacin is the same medication that was prescribed in 12/2012 by Dr. Perrin Maltese for her UTI.  2.  Prilosec is the heartburn/reflux medication that I discussed starting to see if her nighttime chest pain would improve.  Recommend taking one tablet daily for the next month.  3.  I have also placed a request for a cardiology referral; our office will be contacting her in the upcoming month with appointment with cardiology.

## 2013-07-09 NOTE — Telephone Encounter (Signed)
lmom to cb. 

## 2013-07-10 ENCOUNTER — Ambulatory Visit (INDEPENDENT_AMBULATORY_CARE_PROVIDER_SITE_OTHER): Payer: Medicare PPO | Admitting: Family Medicine

## 2013-07-10 VITALS — BP 126/88 | HR 85 | Temp 98.0°F | Resp 16 | Ht 63.25 in | Wt 234.0 lb

## 2013-07-10 DIAGNOSIS — R350 Frequency of micturition: Secondary | ICD-10-CM

## 2013-07-10 DIAGNOSIS — R300 Dysuria: Secondary | ICD-10-CM

## 2013-07-10 DIAGNOSIS — M549 Dorsalgia, unspecified: Secondary | ICD-10-CM

## 2013-07-10 LAB — POCT URINALYSIS DIPSTICK
Bilirubin, UA: NEGATIVE
Glucose, UA: NEGATIVE
Ketones, UA: NEGATIVE
NITRITE UA: NEGATIVE
PH UA: 6.5
PROTEIN UA: NEGATIVE
Spec Grav, UA: 1.01
UROBILINOGEN UA: 0.2

## 2013-07-10 LAB — URINE CULTURE: Colony Count: >=100000

## 2013-07-10 LAB — POCT UA - MICROSCOPIC ONLY
CASTS, UR, LPF, POC: NEGATIVE
Crystals, Ur, HPF, POC: NEGATIVE
Mucus, UA: NEGATIVE
YEAST UA: NEGATIVE

## 2013-07-10 MED ORDER — CEFTRIAXONE SODIUM 1 G IJ SOLR
1.0000 g | INTRAMUSCULAR | Status: DC
  Administered 2013-07-10: 1 g via INTRAMUSCULAR

## 2013-07-10 MED ORDER — KETOROLAC TROMETHAMINE 60 MG/2ML IM SOLN
60.0000 mg | Freq: Once | INTRAMUSCULAR | Status: AC
  Administered 2013-07-10: 60 mg via INTRAMUSCULAR

## 2013-07-10 NOTE — Telephone Encounter (Signed)
Lm for rtn call 

## 2013-07-10 NOTE — Progress Notes (Signed)
Patient ID: Darlene Parker MRN: [id number], DOB: 11-18-1935, 78 y.o. Date of Encounter: 07/10/2013, 2:29 PM  Primary Physician: Tally Due, MD  Chief Complaint:  Chief Complaint  Patient presents with  . Follow-up    UTI, still having burning and increased frequency, seen 1/18    HPI: 78 y.o. year old female presents with 7 day history of dysuria, urgency, and frequency. Last UTI was July 2014 No hematuria She is taking her Cipro bid without much improvement.  No sick contacts, recent antibiotics, or recent travels.   No vaginal discharge, back pain, fever  Past Medical History  Diagnosis Date  . Arthritis     RA  . Scoliosis   . Hypertension   . Depression   . Anxiety   . Osteoporosis   . Movement disorder      Home Meds: Prior to Admission medications   Medication Sig Start Date End Date Taking? Authorizing Provider  aspirin EC 325 MG tablet Take 1 tablet (325 mg total) by mouth daily. 11/10/12  Yes Joya Gaskins, MD  ciprofloxacin (CIPRO) 500 MG tablet Take 1 tablet (500 mg total) by mouth 2 (two) times daily. 07/08/13  Yes Ethelda Chick, MD  clonazePAM (KLONOPIN) 0.5 MG tablet Take 1 tablet (0.5 mg total) by mouth 2 (two) times daily. 05/28/13  Yes Nilda Riggs, NP  gabapentin (NEURONTIN) 300 MG capsule Take 1 capsule (300 mg total) by mouth 3 (three) times daily. 05/28/13  Yes Nilda Riggs, NP  lisinopril (PRINIVIL,ZESTRIL) 20 MG tablet Take 1 tablet (20 mg total) by mouth daily. 02/21/13  Yes Nilda Riggs, NP  mirtazapine (REMERON) 15 MG tablet Take 1 tablet (15 mg total) by mouth at bedtime. Will refill for one month. Pt needs to see PCP Sigmund Hazel 05/28/13  Yes Nilda Riggs, NP  nitroGLYCERIN (NITROSTAT) 0.4 MG SL tablet Place 1 tablet (0.4 mg total) under the tongue every 5 (five) minutes as needed. 02/21/13  Yes Nilda Riggs, NP  omeprazole (PRILOSEC) 20 MG capsule Take 1 capsule (20 mg total) by mouth daily.  07/08/13  Yes Ethelda Chick, MD  simvastatin (ZOCOR) 20 MG tablet TAKE 1 TABLET (20 MG TOTAL) BY MOUTH EVERY EVENING. 05/02/13  Yes York Spaniel, MD  traMADol (ULTRAM) 50 MG tablet Take 1 tablet (50 mg total) by mouth every 6 (six) hours as needed. 05/03/13  Yes York Spaniel, MD    Allergies: No Known Allergies  History   Social History  . Marital Status: Widowed    Spouse Name: N/A    Number of Children: 3  . Years of Education: 13   Occupational History  .     Social History Main Topics  . Smoking status: Former Games developer  . Smokeless tobacco: Never Used  . Alcohol Use: Yes  . Drug Use: No  . Sexual Activity: Not on file   Other Topics Concern  . Not on file   Social History Narrative   Patient is a widow and lives alone.   Patient has three children.   Patient is retired.   Patient has a college education.   Patient is right handed.   Patient does drink soda rarely.     Review of Systems: Constitutional: negative for chills, fever, night sweats or weight changes Cardiovascular: negative for chest pain or palpitations Respiratory: negative for hemoptysis, wheezing, or shortness of breath Abdominal: negative for abdominal pain, nausea, vomiting or diarrhea Dermatological: negative for rash  Neurologic: negative for headache   Physical Exam: Blood pressure 126/88, pulse 85, temperature 98 F (36.7 C), temperature source Oral, resp. rate 16, height 5' 3.25" (1.607 m), weight 234 lb (106.142 kg), SpO2 91.00%., Body mass index is 41.1 kg/(m^2). General: Well developed, well nourished, in no acute distress. Head: Normocephalic, atraumatic, eyes without discharge, sclera non-icteric, nares are congested. Bilateral auditory canals clear, TM's are without perforation, pearly grey with reflective cone of light bilaterally. Serous effusion bilaterally behind TM's. Maxillary sinus TTP. Oral cavity moist, dentition normal. Posterior pharynx with post nasal drip and mild  erythema. No peritonsillar abscess or tonsillar exudate. Neck: Supple. No thyromegaly. Full ROM. No lymphadenopathy. Lungs: Coarse breath sounds bilaterally without Clear bilaterally to auscultation without wheezes, rales, or rhonchi. Breathing is unlabored. Heart: RRR with S1 S2. No murmurs, rubs, or gallops appreciated. Abdomen: Soft, non-tender, non-distended with normoactive bowel sounds. No hepatosplenomegaly. No rebound/guarding. No obvious abdominal masses. McBurney's, Rovsing's, Iliopsoas, and table jar all negative. Msk:  Strength and tone normal for age. Extremities: No clubbing or cyanosis. No edema. Neuro: Alert and oriented X 3. Moves all extremities spontaneously. CNII-XII grossly in tact. Psych:  Responds to questions appropriately with a normal affect.   Results for orders placed in visit on 07/10/13  POCT UA - MICROSCOPIC ONLY      Result Value Range   WBC, Ur, HPF, POC 0-2     RBC, urine, microscopic 0-1     Bacteria, U Microscopic trace     Mucus, UA neg     Epithelial cells, urine per micros 0-3     Crystals, Ur, HPF, POC neg     Casts, Ur, LPF, POC neg     Yeast, UA neg    POCT URINALYSIS DIPSTICK      Result Value Range   Color, UA yellow     Clarity, UA clear     Glucose, UA neg     Bilirubin, UA neg     Ketones, UA neg     Spec Grav, UA 1.010     Blood, UA trace-intact     pH, UA 6.5     Protein, UA neg     Urobilinogen, UA 0.2     Nitrite, UA neg     Leukocytes, UA small (1+)       ASSESSMENT AND PLAN:  78 y.o. year old female with Increased frequency of urination - Plan: POCT UA - Microscopic Only, POCT urinalysis dipstick, cefTRIAXone (ROCEPHIN) injection 1 g  Burning with urination - Plan: POCT UA - Microscopic Only, POCT urinalysis dipstick, cefTRIAXone (ROCEPHIN) injection 1 g   - -Mucinex -Tylenol/Motrin prn -Rest/fluids -RTC precautions -RTC 3-5 days if no improvement  Signed, Elvina Sidle, MD 07/10/2013 2:29 PM

## 2013-07-11 ENCOUNTER — Telehealth: Payer: Self-pay | Admitting: *Deleted

## 2013-07-12 NOTE — Telephone Encounter (Signed)
Called pt, LMOM to CB. 

## 2013-07-13 NOTE — Telephone Encounter (Signed)
Duplicate message. 

## 2013-07-13 NOTE — Telephone Encounter (Signed)
LM for rtn call. 

## 2013-07-13 NOTE — Telephone Encounter (Signed)
Patient returned your call.  [phone]

## 2013-07-16 NOTE — Telephone Encounter (Signed)
Patient came in to see Dr. Milus Glazier on 1/20. She was given Toradol and Rocephin 1g. She was advised to RTC in 3-5 days if not better.

## 2013-07-16 NOTE — Telephone Encounter (Signed)
Left message on machine to call back  

## 2013-07-20 ENCOUNTER — Other Ambulatory Visit: Payer: Self-pay | Admitting: Neurology

## 2013-07-20 NOTE — Telephone Encounter (Signed)
Rx signed and faxed.

## 2013-07-31 ENCOUNTER — Ambulatory Visit: Payer: Medicare PPO

## 2013-07-31 ENCOUNTER — Ambulatory Visit (INDEPENDENT_AMBULATORY_CARE_PROVIDER_SITE_OTHER): Payer: Medicare PPO | Admitting: Family Medicine

## 2013-07-31 VITALS — BP 130/90 | HR 84 | Temp 99.0°F | Resp 16 | Ht 63.0 in | Wt 237.6 lb

## 2013-07-31 DIAGNOSIS — R109 Unspecified abdominal pain: Secondary | ICD-10-CM

## 2013-07-31 DIAGNOSIS — R3989 Other symptoms and signs involving the genitourinary system: Secondary | ICD-10-CM

## 2013-07-31 DIAGNOSIS — R319 Hematuria, unspecified: Secondary | ICD-10-CM

## 2013-07-31 DIAGNOSIS — N361 Urethral diverticulum: Secondary | ICD-10-CM

## 2013-07-31 DIAGNOSIS — R399 Unspecified symptoms and signs involving the genitourinary system: Secondary | ICD-10-CM

## 2013-07-31 DIAGNOSIS — I517 Cardiomegaly: Secondary | ICD-10-CM

## 2013-07-31 DIAGNOSIS — L819 Disorder of pigmentation, unspecified: Secondary | ICD-10-CM

## 2013-07-31 LAB — POCT URINALYSIS DIPSTICK
Bilirubin, UA: NEGATIVE
Glucose, UA: NEGATIVE
Ketones, UA: NEGATIVE
Nitrite, UA: NEGATIVE
PH UA: 7
PROTEIN UA: NEGATIVE
RBC UA: NEGATIVE
SPEC GRAV UA: 1.01
UROBILINOGEN UA: 0.2

## 2013-07-31 LAB — POCT CBC
Granulocyte percent: 59.6 %G (ref 37–80)
HCT, POC: 45.9 % (ref 37.7–47.9)
Hemoglobin: 14.3 g/dL (ref 12.2–16.2)
Lymph, poc: 2.2 (ref 0.6–3.4)
MCH, POC: 31.5 pg — AB (ref 27–31.2)
MCHC: 31.2 g/dL — AB (ref 31.8–35.4)
MCV: 101 fL — AB (ref 80–97)
MID (cbc): 0.7 (ref 0–0.9)
MPV: 8.4 fL (ref 0–99.8)
POC Granulocyte: 4.2 (ref 2–6.9)
POC LYMPH %: 30.9 % (ref 10–50)
POC MID %: 9.5 %M (ref 0–12)
Platelet Count, POC: 232 10*3/uL (ref 142–424)
RBC: 4.54 M/uL (ref 4.04–5.48)
RDW, POC: 14 %
WBC: 7 10*3/uL (ref 4.6–10.2)

## 2013-07-31 LAB — COMPREHENSIVE METABOLIC PANEL
ALBUMIN: 3.9 g/dL (ref 3.5–5.2)
ALT: 14 U/L (ref 0–35)
AST: 20 U/L (ref 0–37)
Alkaline Phosphatase: 53 U/L (ref 39–117)
BUN: 11 mg/dL (ref 6–23)
CHLORIDE: 101 meq/L (ref 96–112)
CO2: 28 meq/L (ref 19–32)
Calcium: 9 mg/dL (ref 8.4–10.5)
Creat: 0.82 mg/dL (ref 0.50–1.10)
Glucose, Bld: 97 mg/dL (ref 70–99)
Potassium: 4.4 mEq/L (ref 3.5–5.3)
Sodium: 137 mEq/L (ref 135–145)
TOTAL PROTEIN: 7.1 g/dL (ref 6.0–8.3)
Total Bilirubin: 0.5 mg/dL (ref 0.2–1.2)

## 2013-07-31 LAB — POCT UA - MICROSCOPIC ONLY
Casts, Ur, LPF, POC: NEGATIVE
Crystals, Ur, HPF, POC: NEGATIVE
YEAST UA: NEGATIVE

## 2013-07-31 MED ORDER — CEPHALEXIN 500 MG PO CAPS: 500.0000 mg | ORAL_CAPSULE | Freq: Two times a day (BID) | ORAL | Status: DC

## 2013-07-31 NOTE — Patient Instructions (Addendum)
It does appear that you have a UTI again. I am going to try keflex this time to see if your bladder will stay healthy longer.  Please let me know if you are not feeling better in the next few days, and I will be in touch regarding your urine culture and other labs.  I will also set you up to see urology to discuss your bladder issues further  Your back is very bad.  You are right that I do not think surgery may be a good option for you, but if you would like to see a surgeon to talk about your options we are glad to arrange this.    Please call the [facility] at [phone] and request home delivery of your medication  (keflex 500 twice a day for one week).

## 2013-07-31 NOTE — Progress Notes (Addendum)
[facility] [address], [place] [address] [phone]  Date:  07/31/2013   Name:  Darlene Parker   DOB:  [birth year]   MRN:  [id number]  PCP:  Tally Due, MD    Chief Complaint: stomach hurts x 6 mths, urinary frequency and blood in urine x 2-3 weeks   History of Present Illness:  Darlene Parker is a 78 y.o. very pleasant female patient who presents with the following:  She is here today with possible recurrent UTI; recently had e coli UTIs in July and January.  She has noted sx of difficulty with urination, dysuria for a couple of days. She has noted blood in the tissue for a couple of weeks- she will also see blood in her urine.  She has not noted any blood in her stool.    She also notes that "my stomach hurts" for the last 6 months.  "I have had this stomach pain for 2 years and only had 2 x-rays." She had moved here from [place] and was getting her care there; unsure if she had some evaluation of this issue there.  She reports that she had an "x-ray" of her stomach somewhere in [place] at some point in the past couple of years- it "looked like a green watermelon in there."  She is not continuing to follow-up and is not sure if she was supposed to or not.  She reports that she was told that her x-ray was normal but she looked at it and did not think it was normal.  "My stomach is not supposed to be this big."  It seems that she has had endoscopy and ?a Ct or ultrasound of her abdomen but she does not know where or when these were done    Admits she has not had a pap or pelvic in many years.   It appears she is taking tramadol per Dr.Willis for her back pain, she also takes gabapentin for peripheral neuropathy.  No fever No nausea, vomiting or diarrhea  Patient Active Problem List   Diagnosis Date Noted  . Tremor 12/26/2012  . Insomnia 11/22/2010  . Chronic back pain 11/08/2010  . Hypertension 11/08/2010  . Urinary frequency 11/08/2010     Past Medical History  Diagnosis Date  . Arthritis     RA  . Scoliosis   . Hypertension   . Depression   . Anxiety   . Osteoporosis   . Movement disorder     Past Surgical History  Procedure Laterality Date  . Appendectomy    . Cholecystectomy      History  Substance Use Topics  . Smoking status: Former Games developer  . Smokeless tobacco: Never Used  . Alcohol Use: Yes    No family history on file.  No Known Allergies  Medication list has been reviewed and updated.  Current Outpatient Prescriptions on File Prior to Visit  Medication Sig Dispense Refill  . clonazePAM (KLONOPIN) 0.5 MG tablet Take 1 tablet (0.5 mg total) by mouth 2 (two) times daily.  60 tablet  5  . gabapentin (NEURONTIN) 300 MG capsule Take 1 capsule (300 mg total) by mouth 3 (three) times daily.  90 capsule  6  . lisinopril (PRINIVIL,ZESTRIL) 20 MG tablet Take 1 tablet (20 mg total) by mouth daily.  30 tablet  1  . mirtazapine (REMERON) 15 MG tablet Take 1 tablet (15 mg total) by mouth at bedtime. Will refill for one month. Pt needs  to see PCP Sigmund Hazel  30 tablet  0  . omeprazole (PRILOSEC) 20 MG capsule Take 1 capsule (20 mg total) by mouth daily.  30 capsule  5  . simvastatin (ZOCOR) 20 MG tablet TAKE 1 TABLET (20 MG TOTAL) BY MOUTH EVERY EVENING.  30 tablet  5  . traMADol (ULTRAM) 50 MG tablet TAKE 1 TABLET EVERY 6 HOURS AS NEEDED  120 tablet  1  . aspirin EC 325 MG tablet Take 1 tablet (325 mg total) by mouth daily.  14 tablet  0  . ciprofloxacin (CIPRO) 500 MG tablet Take 1 tablet (500 mg total) by mouth 2 (two) times daily.  14 tablet  0  . nitroGLYCERIN (NITROSTAT) 0.4 MG SL tablet Place 1 tablet (0.4 mg total) under the tongue every 5 (five) minutes as needed.  30 tablet  0   Current Facility-Administered Medications on File Prior to Visit  Medication Dose Route Frequency Provider Last Rate Last Dose  . cefTRIAXone (ROCEPHIN) injection 1 g  1 g Intramuscular Q24H Elvina Sidle, MD   1 g at  07/10/13 1445    Review of Systems:  As per HPI- otherwise negative.   Physical Examination: Filed Vitals:   07/31/13 1403  BP: 130/90  Pulse: 84  Temp: 99 F (37.2 C)  Resp: 16   Filed Vitals:   07/31/13 1403  Height: 5\' 3"  (1.6 m)  Weight: 237 lb 9.6 oz (107.775 kg)   Body mass index is 42.1 kg/(m^2). Ideal Body Weight: Weight in (lb) to have BMI = 25: 140.8  GEN: WDWN, NAD, Non-toxic, A & O x 3, obese, significant tremor.  Appears chronically debilitated.  Uses a walker HEENT: Atraumatic, Normocephalic. Neck supple. No masses, No LAD. Ears and Nose: No external deformity. CV: RRR, No M/G/R. No JVD. No thrill. No extra heart sounds. PULM: CTA B, no wheezes, crackles, rhonchi. No retractions. No resp. distress. No accessory muscle use. ABD: S, ND, +BS. No rebound. No HSM. Pt very uncomfortable during exam due to her back pain. She endorses diffuse tenderness through her entire abdomen.  However this pain is not persistent when I palpated her abdomen during pelvic exam EXTR: No c/c/e NEURO uses a walker. Slow gait. Severe kyphosis.   PSYCH: Normally interactive. Conversant, seems depressed and easily irritated.  Gu: performed pelvic exam to the best of the patient's ability- she is not able to get in lithotomy position. . Unable to visualize cervix due to patient factors. Negative BME,no adnexal pain or tenderness. No evidence of blood in the vaginal vault.   Results for orders placed in visit on 07/31/13  POCT URINALYSIS DIPSTICK      Result Value Range   Color, UA yellow     Clarity, UA clear     Glucose, UA neg     Bilirubin, UA neg     Ketones, UA neg     Spec Grav, UA 1.010     Blood, UA neg     pH, UA 7.0     Protein, UA neg     Urobilinogen, UA 0.2     Nitrite, UA neg     Leukocytes, UA small (1+)    POCT UA - MICROSCOPIC ONLY      Result Value Range   WBC, Ur, HPF, POC 10-15     RBC, urine, microscopic 0-2     Bacteria, U Microscopic 2+     Mucus, UA  small     Epithelial cells, urine per  micros 8-12     Crystals, Ur, HPF, POC neg     Casts, Ur, LPF, POC neg     Yeast, UA neg    POCT CBC      Result Value Range   WBC 7.0  4.6 - 10.2 K/uL   Lymph, poc 2.2  0.6 - 3.4   POC LYMPH PERCENT 30.9  10 - 50 %L   MID (cbc) 0.7  0 - 0.9   POC MID % 9.5  0 - 12 %M   POC Granulocyte 4.2  2 - 6.9   Granulocyte percent 59.6  37 - 80 %G   RBC 4.54  4.04 - 5.48 M/uL   Hemoglobin 14.3  12.2 - 16.2 g/dL   HCT, POC 40.9  73.5 - 47.9 %   MCV 101.0 (*) 80 - 97 fL   MCH, POC 31.5 (*) 27 - 31.2 pg   MCHC 31.2 (*) 31.8 - 35.4 g/dL   RDW, POC 32.9     Platelet Count, POC 232  142 - 424 K/uL   MPV 8.4  0 - 99.8 fL   [facility] reading (PRIMARY) by  [name]. Abdominal series: severe scoliosis and degenerative change, mild cardiomegaly.  Otherwise no acute finding.    ACUTE ABDOMEN SERIES (ABDOMEN 2 VIEW & CHEST 1 VIEW)  COMPARISON: Chest radiograph 11/09/2012  FINDINGS: Enlargement of cardiac silhouette.  Tortuous aorta.  Mediastinal contours and pulmonary vascularity normal.  Bibasilar atelectasis. Lungs otherwise clear. No pleural effusion or pneumothorax. Surgical clips right upper quadrant question cholecystectomy. Nonobstructive bowel gas pattern. No bowel dilatation or bowel wall thickening or free intraperitoneal air.  Numerous pelvic phleboliths. Thoracolumbar scoliosis with extensive degenerative disc disease changes of the lumbar spine. Bones appear demineralized. No urinary tract calcification.  IMPRESSION: Enlargement of cardiac silhouette. Bibasilar atelectasis. No acute abdominal findings.  Assessment and Plan: Urinary tract infection symptoms - Plan: POCT urinalysis dipstick, POCT UA - Microscopic Only, Urine culture, cephALEXin (KEFLEX) 500 MG capsule, DISCONTINUED: cephALEXin (KEFLEX) 500 MG capsule  Abdominal pain, unspecified site - Plan: DG Abd Acute W/Chest, POCT CBC, Comprehensive metabolic panel  Hematuria -  Plan: Ambulatory referral to Urology  I spent over an hour face to face with this complex and debilitated patient. Extra time needed due to her physical and emotional issues today.    Probably recurrent UTI.  Will treat with keflex.  Await culture.  Sent rx to 2 different pharmacies and gave info about pharmacy that may deliver as she is concerned about picking up her medication Abdominal pain of long duration. At this time no evidence of acute dangerous pathology.  Await CMP.  She is insistent that she needs to leave while a ride is available so not able to gather outside records today.  Will plan to follow-up this issue at our next visit Hematuria: uncertain source but I do not see evidence of vaginal bleeding. May be related to UTI but 2 weeks of gross bleeding may warrant further evaluation.  Referral to urology  Chronic back pain. We also did not have time to delve into this issue due to her transportation needs.  She is aware that surgery is probably not a good option for her, but discussed a possible neurosurgical consultation to explore non- surgical options. she is currently taking tramadol.  However this is not fully controlling her pain.  We may need to adjust her regimen, or she can certainly discuss this with Dr. Anne Hahn.    Asked her to come  and see [place] again to further work on her problems at her convenience.   Will plan further follow- up pending labs. Although she expressed difficulty in passing urine she was able to urinate fully here in clinic.  Instructed her to seek care right away if not able to urinate  Signed Abbe Amsterdam, MD  2/13: called her to go over her labs and x-rays.  Her urine culture is negative.  Given her gross hematuria, it is now especially important that she see urology.  We will take care of this for her.  Her x-rays are normal except the radiologist did mention possible cardiomegaly.  She does not have a heart doctor and has not heard this in the past.  I  will refer her for an echo to find out more.  She is ok with this plan.

## 2013-08-01 LAB — URINE CULTURE
COLONY COUNT: NO GROWTH
ORGANISM ID, BACTERIA: NO GROWTH

## 2013-08-03 ENCOUNTER — Encounter: Payer: Self-pay | Admitting: Family Medicine

## 2013-08-03 ENCOUNTER — Ambulatory Visit: Payer: Medicare HMO | Admitting: Internal Medicine

## 2013-08-03 NOTE — Addendum Note (Signed)
Addended by: Abbe Amsterdam C on: 08/03/2013 05:19 PM   Modules accepted: Orders

## 2013-08-13 ENCOUNTER — Telehealth: Payer: Self-pay

## 2013-08-13 DIAGNOSIS — M479 Spondylosis, unspecified: Secondary | ICD-10-CM

## 2013-08-13 NOTE — Telephone Encounter (Signed)
Patient is calling to get a referral to see a back surgeon (this is what she said however she may have meant specialist or therapy). States that she saw [name] and they discussed a referral to someone who can help her with her back.   [phone]

## 2013-08-13 NOTE — Telephone Encounter (Signed)
Pt is wanting to talk with dr copland

## 2013-08-15 ENCOUNTER — Telehealth: Payer: Self-pay | Admitting: Cardiology

## 2013-08-15 NOTE — Telephone Encounter (Signed)
Per answering service from yesterday-Returning call from the office.

## 2013-08-15 NOTE — Telephone Encounter (Signed)
Per last OV indicated ok to refer. I have pended this order.

## 2013-08-15 NOTE — Telephone Encounter (Signed)
No need for this message

## 2013-08-17 ENCOUNTER — Encounter: Payer: Self-pay | Admitting: Cardiovascular Disease

## 2013-08-17 ENCOUNTER — Ambulatory Visit (HOSPITAL_COMMUNITY): Payer: Medicare HMO | Attending: Cardiovascular Disease | Admitting: Radiology

## 2013-08-17 DIAGNOSIS — R9389 Abnormal findings on diagnostic imaging of other specified body structures: Secondary | ICD-10-CM | POA: Insufficient documentation

## 2013-08-17 DIAGNOSIS — I517 Cardiomegaly: Secondary | ICD-10-CM | POA: Insufficient documentation

## 2013-08-17 DIAGNOSIS — R0609 Other forms of dyspnea: Secondary | ICD-10-CM | POA: Insufficient documentation

## 2013-08-17 DIAGNOSIS — I079 Rheumatic tricuspid valve disease, unspecified: Secondary | ICD-10-CM | POA: Insufficient documentation

## 2013-08-17 DIAGNOSIS — R0989 Other specified symptoms and signs involving the circulatory and respiratory systems: Secondary | ICD-10-CM | POA: Insufficient documentation

## 2013-08-17 DIAGNOSIS — Z87891 Personal history of nicotine dependence: Secondary | ICD-10-CM | POA: Insufficient documentation

## 2013-08-17 DIAGNOSIS — R079 Chest pain, unspecified: Secondary | ICD-10-CM | POA: Insufficient documentation

## 2013-08-17 NOTE — Progress Notes (Signed)
Echocardiogram performed.  

## 2013-08-29 ENCOUNTER — Encounter: Payer: Self-pay | Admitting: Family Medicine

## 2013-09-06 ENCOUNTER — Other Ambulatory Visit: Payer: Self-pay | Admitting: Neurology

## 2013-09-06 MED ORDER — CLONAZEPAM 0.5 MG PO TABS: 0.5000 mg | ORAL_TABLET | Freq: Two times a day (BID) | ORAL | Status: DC

## 2013-09-07 ENCOUNTER — Ambulatory Visit (INDEPENDENT_AMBULATORY_CARE_PROVIDER_SITE_OTHER): Payer: Medicare PPO | Admitting: Family Medicine

## 2013-09-07 VITALS — BP 122/74 | HR 99 | Temp 98.2°F | Resp 17 | Ht 64.5 in | Wt 229.0 lb

## 2013-09-07 DIAGNOSIS — R350 Frequency of micturition: Secondary | ICD-10-CM

## 2013-09-07 DIAGNOSIS — R82998 Other abnormal findings in urine: Secondary | ICD-10-CM

## 2013-09-07 DIAGNOSIS — R600 Localized edema: Secondary | ICD-10-CM

## 2013-09-07 DIAGNOSIS — R143 Flatulence: Secondary | ICD-10-CM

## 2013-09-07 DIAGNOSIS — M549 Dorsalgia, unspecified: Secondary | ICD-10-CM

## 2013-09-07 DIAGNOSIS — R142 Eructation: Secondary | ICD-10-CM

## 2013-09-07 DIAGNOSIS — R141 Gas pain: Secondary | ICD-10-CM

## 2013-09-07 DIAGNOSIS — R8290 Unspecified abnormal findings in urine: Secondary | ICD-10-CM

## 2013-09-07 DIAGNOSIS — R140 Abdominal distension (gaseous): Secondary | ICD-10-CM

## 2013-09-07 DIAGNOSIS — R609 Edema, unspecified: Secondary | ICD-10-CM

## 2013-09-07 LAB — POCT URINALYSIS DIPSTICK
Bilirubin, UA: NEGATIVE
Blood, UA: NEGATIVE
GLUCOSE UA: NEGATIVE
Ketones, UA: NEGATIVE
Nitrite, UA: NEGATIVE
SPEC GRAV UA: 1.02
Urobilinogen, UA: 0.2
pH, UA: 7

## 2013-09-07 NOTE — Progress Notes (Addendum)
[facility] [address] [place] [address] [phone]  Date:  09/07/2013   Name:  Darlene Parker   DOB:  1935-12-10   MRN:  [id number]  PCP:  Tally Due, MD    Chief Complaint: swollen feet and Abdominal Pain   History of Present Illness:  Darlene Parker is a 78 y.o. very pleasant female patient who presents with the following:  She is here today because she is "miserable."  She notes that her feet have been swollen for a couple of weeks. Her toes "are wrinkled."  She feels that her skin looks more white than usual.  Also notes that she is having frequent urination and that her urine has a bad odor.  No fever.    Also with complaint that "my stomach hurts constantly."  Has noted this for 2 years or so.  She has had a cholecystectomy and appendectomy.   It "keeps getting bigger."   She has seen commercials for a juicer on TV and plans to start drinking juices to lose weight.   She would like to see neurosurgery to discuss her chronic back pain.    Her CMP was normal in February  She had an echo in February due to cardiomegaly- normal EF but likely diastolic dysfunction.  She had been referred to cardiology but declined to be seen.  She also refused urology appt for history of gross hematuria.    She had appendicitis when she was in her 43s and is certain that her appendix was removed.    Wt Readings from Last 3 Encounters:  09/07/13 229 lb (103.874 kg)  07/31/13 237 lb 9.6 oz (107.775 kg)  07/10/13 234 lb (106.142 kg)     Patient Active Problem List   Diagnosis Date Noted  . Tremor 12/26/2012  . Insomnia 11/22/2010  . Chronic back pain 11/08/2010  . Hypertension 11/08/2010  . Urinary frequency 11/08/2010    Past Medical History  Diagnosis Date  . Arthritis     RA  . Scoliosis   . Hypertension   . Depression   . Anxiety   . Osteoporosis   . Movement disorder     Past Surgical History  Procedure Laterality Date  .  Appendectomy    . Cholecystectomy      History  Substance Use Topics  . Smoking status: Former Games developer  . Smokeless tobacco: Never Used  . Alcohol Use: Yes    No family history on file.  No Known Allergies  Medication list has been reviewed and updated.  Current Outpatient Prescriptions on File Prior to Visit  Medication Sig Dispense Refill  . aspirin EC 325 MG tablet Take 1 tablet (325 mg total) by mouth daily.  14 tablet  0  . cephALEXin (KEFLEX) 500 MG capsule Take 1 capsule (500 mg total) by mouth 2 (two) times daily.  14 capsule  0  . clonazePAM (KLONOPIN) 0.5 MG tablet Take 1 tablet (0.5 mg total) by mouth 2 (two) times daily.  60 tablet  1  . gabapentin (NEURONTIN) 300 MG capsule Take 1 capsule (300 mg total) by mouth 3 (three) times daily.  90 capsule  6  . lisinopril (PRINIVIL,ZESTRIL) 20 MG tablet Take 1 tablet (20 mg total) by mouth daily.  30 tablet  1  . mirtazapine (REMERON) 15 MG tablet Take 1 tablet (15 mg total) by mouth at bedtime. Will refill for one month. Pt needs to see PCP Sigmund Hazel  30 tablet  0  . nitroGLYCERIN (NITROSTAT) 0.4 MG SL tablet Place 1 tablet (0.4 mg total) under the tongue every 5 (five) minutes as needed.  30 tablet  0  . omeprazole (PRILOSEC) 20 MG capsule Take 1 capsule (20 mg total) by mouth daily.  30 capsule  5  . simvastatin (ZOCOR) 20 MG tablet TAKE 1 TABLET (20 MG TOTAL) BY MOUTH EVERY EVENING.  30 tablet  5  . traMADol (ULTRAM) 50 MG tablet take 1 tablet by mouth every 6 hours if needed  120 tablet  1   No current facility-administered medications on file prior to visit.    Review of Systems:  As per HPI- otherwise negative.   Physical Examination: Filed Vitals:   09/07/13 1341  BP: 122/74  Pulse: 99  Temp: 98.2 F (36.8 C)  Resp: 17   Filed Vitals:   09/07/13 1341  Height: 5' 4.5" (1.638 m)  Weight: 229 lb (103.874 kg)   Body mass index is 38.71 kg/(m^2). Ideal Body Weight: Weight in (lb) to have BMI = 25:  147.6  GEN: WDWN, NAD, Non-toxic, A & O x 3, obese, visible tremor HEENT: Atraumatic, Normocephalic. Neck supple. No masses, No LAD. Ears and Nose: No external deformity. CV: RRR, No M/G/R. No JVD. No thrill. No extra heart sounds. PULM: CTA B, no wheezes, crackles, rhonchi. No retractions. No resp. distress. No accessory muscle use. ABD: S, ND, +BS. No rebound. No HSM.  She has complaint of diffuse mild tenderness over her entire abdomen.  No discrete area of more tenderness  EXTR: No c/c/e NEURO Normal gait.  PSYCH: seems depressed; many complaints.  Comments that "I hate to look at my body, it's so fat."    Trace edema of bilateral LE.   Results for orders placed in visit on 09/07/13  POCT URINALYSIS DIPSTICK      Result Value Ref Range   Color, UA yellow     Clarity, UA clear     Glucose, UA neg     Bilirubin, UA neg     Ketones, UA neg     Spec Grav, UA 1.020     Blood, UA neg     pH, UA 7.0     Protein, UA trace     Urobilinogen, UA 0.2     Nitrite, UA neg     Leukocytes, UA small (1+)     Refused the blood draw that I ordered.  Wanted to leave clinic and did not wait for urine results.   Assessment and Plan: Urinary frequency - Plan: POCT urinalysis dipstick, Urine culture, CANCELED: POCT UA - Microscopic Only  Abnormal urine odor - Plan: CANCELED: POCT CBC  Back pain - Plan: Ambulatory referral to Neurosurgery  Leg edema  Canceled micro of her urine due to small amount of urine collected. Await culture.   Persistent abdominal pain.  Refused blood draw today. Explained that the purpose of the blood draw is to help [place] exclude any dangerous causes of abdominal pain.  However, as her pain has been present for years it is likely to be benign.  I thought she had agreed to blood draw, but then she changed her mind and left.   Pt left clinic before I could finish discussing the plan with her.  Will await urine culture and call her.  Would suggest that we revisit seeing  cardiology; her diastolic dysfunction may contribute to her LE edema.  Also might consider an abdominal ultrasound.    Signed Shanda Bumps  Copland, MD  3/21; called to check on her as we did not get to wrap up yesterday.  Play to try some prn HCTZ, and will treat any UTI as soon as I get her urine culture back (likely tomorrow).  Also, will do an abdominal ultrasound.  Mentioned that her O2 sat was slightly low yesterday and I did not get a change to recheck- she denies any SOB.    3/22:  Called and LMOM.  I received her urine culture which is negative.  Will call in HCTZ for her, she can take 12.5mg / day as needed for swelling.  On days that she uses this will have her take 1/2 of her lisinopril dose.   Will refer back to cardiology Will schedule abdominal ultrasound

## 2013-09-07 NOTE — Telephone Encounter (Signed)
Rx signed and faxed.

## 2013-09-08 ENCOUNTER — Other Ambulatory Visit: Payer: Self-pay | Admitting: Family Medicine

## 2013-09-09 LAB — URINE CULTURE: Colony Count: 30000

## 2013-09-09 MED ORDER — HYDROCHLOROTHIAZIDE 12.5 MG PO CAPS: ORAL_CAPSULE | ORAL | Status: DC

## 2013-09-09 NOTE — Addendum Note (Signed)
Addended by: Abbe Amsterdam C on: 09/09/2013 04:26 PM   Modules accepted: Orders

## 2013-09-12 ENCOUNTER — Other Ambulatory Visit: Payer: Self-pay | Admitting: Family Medicine

## 2013-09-12 ENCOUNTER — Telehealth: Payer: Self-pay

## 2013-09-12 NOTE — Telephone Encounter (Signed)
Spoke to the pt. She states this was for the nurse who make house calls for her. Pt stated a sleeping aid was sent to her pharm by the home health nurse?  This message was not intended for [facility].

## 2013-09-12 NOTE — Telephone Encounter (Signed)
[facility] called and states per patient we were calling in a "slepp aid" medication Please call pharmacy at [phone] to advise

## 2013-09-18 ENCOUNTER — Other Ambulatory Visit: Payer: Medicare HMO

## 2013-11-08 ENCOUNTER — Telehealth: Payer: Self-pay | Admitting: Nurse Practitioner

## 2013-11-08 ENCOUNTER — Telehealth: Payer: Self-pay | Admitting: *Deleted

## 2013-11-08 NOTE — Telephone Encounter (Signed)
Please call Dr. Antionette Fairy.  Office: [phone] Cell: [phone] after 4:10pm

## 2013-11-08 NOTE — Telephone Encounter (Signed)
Darlene Parker, Darlene Parker is requested a return call.  Thanks

## 2013-11-08 NOTE — Telephone Encounter (Signed)
TC to Dr. Duanne Moron office, she is wanting to know the name of the patients PCP and also why we are seeing the patient. Dr. Jaynie Collins is a dentist. She is concerned about her multiple medical issues. I gave her what information we have.

## 2013-11-09 NOTE — Telephone Encounter (Signed)
TC to Dr. Jaynie Collins. She is out of the office per [facility]. She will return my call on Monday.

## 2013-11-13 NOTE — Telephone Encounter (Signed)
TC to Dr. Jaynie Collins. She needed contact information of daughter. Patient does not have PCP. She is concerned about some medical issues and wants to discuss with daughter

## 2013-11-26 ENCOUNTER — Ambulatory Visit: Payer: Self-pay | Admitting: Nurse Practitioner

## 2013-11-27 ENCOUNTER — Other Ambulatory Visit: Payer: Self-pay | Admitting: Neurology

## 2013-11-28 ENCOUNTER — Telehealth: Payer: Self-pay | Admitting: Neurology

## 2013-11-28 ENCOUNTER — Encounter: Payer: Self-pay | Admitting: Nurse Practitioner

## 2013-11-28 ENCOUNTER — Ambulatory Visit (INDEPENDENT_AMBULATORY_CARE_PROVIDER_SITE_OTHER): Payer: Commercial Managed Care - HMO | Admitting: Nurse Practitioner

## 2013-11-28 VITALS — BP 141/78 | HR 101 | Ht 64.0 in | Wt 237.0 lb

## 2013-11-28 DIAGNOSIS — R251 Tremor, unspecified: Secondary | ICD-10-CM

## 2013-11-28 DIAGNOSIS — M549 Dorsalgia, unspecified: Secondary | ICD-10-CM

## 2013-11-28 DIAGNOSIS — G8929 Other chronic pain: Secondary | ICD-10-CM

## 2013-11-28 DIAGNOSIS — R259 Unspecified abnormal involuntary movements: Secondary | ICD-10-CM

## 2013-11-28 MED ORDER — GABAPENTIN 300 MG PO CAPS: 300.0000 mg | ORAL_CAPSULE | Freq: Four times a day (QID) | ORAL | Status: DC

## 2013-11-28 MED ORDER — CLONAZEPAM 0.5 MG PO TABS: 0.5000 mg | ORAL_TABLET | Freq: Two times a day (BID) | ORAL | Status: DC

## 2013-11-28 NOTE — Telephone Encounter (Signed)
Patient said that the pain is in her feet and goes up to her back, did not mention during office visit but would like to increase her Gabapentin dosage.

## 2013-11-28 NOTE — Telephone Encounter (Signed)
Patient would like to get dosage of Gabapentin increased--pain is worse--[facility] [address]--please call patient.

## 2013-11-28 NOTE — Progress Notes (Signed)
I have read the note, and I agree with the clinical assessment and plan.  Thai Hemrick KEITH   

## 2013-11-28 NOTE — Telephone Encounter (Signed)
This was discussed in visit, dose was increased and faxed to pharmacy

## 2013-11-28 NOTE — Patient Instructions (Addendum)
Will renew Neurontin and increase dose to QID from TID  Renew Clonazepam.  F/U yearly  Next visit with Dr. Anne Hahn Need to obtain PCP this has been discussed at last 2 visits

## 2013-11-28 NOTE — Progress Notes (Signed)
[facility]  PATIENT: Darlene Parker DOB: August 23, 1935   REASON FOR VISIT: follow up for tremor   HISTORY OF PRESENT ILLNESS: Darlene Parker 78 year old female returns for followup. She has a history of essential tremor for which she is on clonazepam. She also has a history of bipolar disorder. She has a history of back pain however she has been seen by [facility] in the past for epidural steroids with benefit most recently in 2012. She currently resides in an independent living [facility]. Her meals are provided however she has to get her medications. She has not had a primary care physician since she moved to [place] and she has been encouraged to get one at each office visit. The patient is a poor historian and has difficulty telling me when she takes her medications and what she takes. She is alone today at her visit having been dropped off. She also has a history of chronic back pain and is on Ultram and Neurontin. She is asking me to increase her Neurontin dose today. She returns for reevaluation   HISTORY: of an essential tremor. The patient has chronic low back pain and leg discomfort, [name], [name] The patient indicates that she has some sort of a left chest mass that requires evaluation. The patient was told she has borderline diabetes, and that there is some indication that she may have bipolar disorder, [name] The patient has undergone nerve conduction study evaluation previously that did not show evidence of a peripheral neuropathy, but she does complain of some burning sensations in the feet at night, and some discomfort. The patient continues to have tremor, but she has come off of all of her medications. The patient returns for an evaluation.  02/21/13:  Patient returns for followup at her last visit with Darlene Parker 06/05/2012. In the interim she has stopped her medication for tremor. She is no longer seeing her primary care as [facility]ene Parker found out she was getting medications from multiple providers. She is wanting [place] to take on primary care responsibility. She is currently residing in an assisted living facility. [facility] is with her today and says she is supposed to be getting a primary care within the next couple of months according to her.      REVIEW OF SYSTEMS: Full 14 system review of systems performed and notable only for those listed, all others are neg:  Constitutional: Fatigue  Ear/Nose/Throat: N/A  Skin: N/A  Eyes: N/A  Respiratory: N/A  Gastroitestinal: Urinary frequency  Hematology/Lymphatic: N/A  Endocrine: N/A Musculoskeletal:N/A  Allergy/Immunology: N/A  Neurological: Tremors Psychiatric: Depression  Sleep : Insomnia all   ALLERGIES: No Known Allergies  HOME MEDICATIONS: Outpatient Prescriptions Prior to Visit  Medication Sig Dispense Refill  . clonazePAM (KLONOPIN) 0.5 MG tablet Take 1 tablet (0.5 mg total) by mouth 2 (two) times daily.  60 tablet  1  . gabapentin (NEURONTIN) 300 MG capsule Take 1 capsule (300 mg total) by mouth 3 (three) times daily.  90 capsule  6  . hydrochlorothiazide (MICROZIDE) 12.5 MG capsule Take 1 by mouth daily as needed for swelling  30 capsule  1  . lisinopril (PRINIVIL,ZESTRIL) 20 MG tablet Take 1 tablet (20 mg total) by mouth daily.  90 tablet  1  .  mirtazapine (REMERON) 15 MG tablet Take 1 tablet (15 mg total) by mouth at bedtime. Will refill for one month. Pt needs to see PCP Darlene Parker  30 tablet  0  . simvastatin (ZOCOR) 20 MG tablet TAKE 1 TABLET (20 MG TOTAL) BY MOUTH EVERY EVENING.  30 tablet  5  . traMADol (ULTRAM) 50 MG tablet take 1 tablet by mouth every 6 hours if needed  120 tablet  1  . aspirin EC 325 MG tablet Take 1 tablet (325 mg total) by mouth daily.  14 tablet  0    . cephALEXin (KEFLEX) 500 MG capsule Take 1 capsule (500 mg total) by mouth 2 (two) times daily.  14 capsule  0  . nitroGLYCERIN (NITROSTAT) 0.4 MG SL tablet Place 1 tablet (0.4 mg total) under the tongue every 5 (five) minutes as needed.  30 tablet  0  . omeprazole (PRILOSEC) 20 MG capsule Take 1 capsule (20 mg total) by mouth daily.  30 capsule  5   No facility-administered medications prior to visit.    PAST MEDICAL HISTORY: Past Medical History  Diagnosis Date  . Arthritis     RA  . Scoliosis   . Hypertension   . Depression   . Anxiety   . Osteoporosis   . Movement disorder     PAST SURGICAL HISTORY: Past Surgical History  Procedure Laterality Date  . Appendectomy    . Cholecystectomy      FAMILY HISTORY: History reviewed. No pertinent family history.  SOCIAL HISTORY: History   Social History  . Marital Status: Widowed    Spouse Name: N/A    Number of Children: 3  . Years of Education: 13   Occupational History  .     Social History Main Topics  . Smoking status: Former Games developer  . Smokeless tobacco: Never Used  . Alcohol Use: Yes  . Drug Use: No  . Sexual Activity: Not on file   Other Topics Concern  . Not on file   Social History Narrative   Patient is a widow and lives alone.   Patient has three children.   Patient is retired.   Patient has a college education.   Patient is right handed.   Patient does drink soda rarely.     PHYSICAL EXAM  Filed Vitals:   11/28/13 1324  BP: 141/78  Pulse: 101  Height: 5\' 4"  (1.626 m)  Weight: 237 lb (107.502 kg)   Body mass index is 40.66 kg/(m^2).  Generalized: Well developed, obese female in no acute distress  Head: normocephalic and atraumatic,. Oropharynx benign  Neck: Supple, no carotid bruits  Musculoskeletal: No deformity   Neurological examination   Mentation: Alert oriented to time, place, history taking. Follows all commands speech and language fluent, vocal tremor  Cranial nerve  II-XII: Pupils were equal round reactive to light extraocular movements were full, visual field were full on confrontational test. Facial sensation and strength were normal. hearing was intact to finger rubbing bilaterally. Uvula tongue midline. head turning and shoulder shrug were normal and symmetric.Tongue protrusion into cheek strength was normal. Motor: normal bulk and tone, full strength in the BUE, BLE, outstretched hand tremor right greater than left  No focal weakness Sensory: normal and symmetric to light touch, pinprick, and  vibration  Coordination: finger-nose-finger, unable to perform heel-to-shin bilaterally,  Reflexes: Brachioradialis 2/2, biceps 2/2, triceps 2/2, patellar 2/2, Achilles 2/2, plantar responses were flexor bilaterally. Gait and Station: Rising up from seated position without assistance,  wide base  stance,  moderate stride, ambulating with a rolling walker, no difficulty with turns DIAGNOSTIC DATA (LABS, IMAGING, TESTING) - I reviewed patient records, labs, notes, testing and imaging myself where available.  Lab Results  Component Value Date   WBC 7.0 07/31/2013   HGB 14.3 07/31/2013   HCT 45.9 07/31/2013   MCV 101.0* 07/31/2013   PLT 228 11/09/2012      Component Value Date/Time   NA 137 07/31/2013 1510   K 4.4 07/31/2013 1510   CL 101 07/31/2013 1510   CO2 28 07/31/2013 1510   GLUCOSE 97 07/31/2013 1510   BUN 11 07/31/2013 1510   CREATININE 0.82 07/31/2013 1510   CREATININE 0.86 11/09/2012 2230   CALCIUM 9.0 07/31/2013 1510   PROT 7.1 07/31/2013 1510   ALBUMIN 3.9 07/31/2013 1510   AST 20 07/31/2013 1510   ALT 14 07/31/2013 1510   ALKPHOS 53 07/31/2013 1510   BILITOT 0.5 07/31/2013 1510   GFRNONAA 65* 11/09/2012 2230   GFRAA 75* 11/09/2012 2230       ASSESSMENT AND PLAN  78 y.o. year old female  has a past medical history of Arthritis; Scoliosis; Hypertension; Depression; Anxiety; Osteoporosis; and essential tremor here to followup. The patient still has not  obtained a primary care physician    Will renew Neurontin and increase dose to QID from TID  Renew Clonazepam.  F/U yearly  Next visit with Darlene Parker Need to obtain PCP this has been discussed at last 2 visits Nilda Riggs, [facility], [facility], APRN  [facility] [address] [place], [place] [address] [phone]

## 2013-11-29 ENCOUNTER — Telehealth: Payer: Self-pay

## 2013-11-29 DIAGNOSIS — Z1211 Encounter for screening for malignant neoplasm of colon: Secondary | ICD-10-CM

## 2013-11-29 NOTE — Telephone Encounter (Signed)
Is this a screening colonoscopy or is she having issues?

## 2013-11-29 NOTE — Telephone Encounter (Signed)
Pt would like to know if she could have referral to have a colonoscopy. Best# [phone]

## 2013-11-30 NOTE — Telephone Encounter (Signed)
She states this is just a screening colonoscopy, but that she needs a referral for insurance purposes.  She said [name] has encouraged her to get the procedure done.

## 2013-11-30 NOTE — Telephone Encounter (Signed)
Called to let her know that I made this referral for her

## 2013-12-04 ENCOUNTER — Telehealth: Payer: Self-pay

## 2013-12-04 ENCOUNTER — Encounter: Payer: Self-pay | Admitting: Internal Medicine

## 2013-12-04 DIAGNOSIS — G8929 Other chronic pain: Secondary | ICD-10-CM

## 2013-12-04 DIAGNOSIS — M549 Dorsalgia, unspecified: Principal | ICD-10-CM

## 2013-12-04 NOTE — Telephone Encounter (Signed)
[name]:   Patient needs referral to [facility].  Says she can hardly walk and she has spoken with you about her problems before.  Can you call her to discuss.  [phone]

## 2013-12-05 NOTE — Telephone Encounter (Signed)
Called her back- she would like a referral to the [facility] and sc[facility] center, and also to pain management on [address].

## 2013-12-19 ENCOUNTER — Encounter: Payer: Self-pay | Admitting: *Deleted

## 2013-12-19 NOTE — Progress Notes (Signed)
Patient ID: Darlene Parker, female   DOB: 19-Mar-1936, 78 y.o.   MRN: [id number] Pt here today for PV for screening colonoscopy.  Last colonoscopy over 10 years ago per pt.  Pt says that she is having abdominal pain and constipation problems.  She also says she does not want to drink a prep for procedure.  She would like to talk about cologuard vs colonoscopy.  Because of abdominal pain, constipation, and pt being undecided about having colonoscopy; new pt appt was scheduled with Dr. Juanda Chance for 8/18 at 2:15.  Colonoscopy scheduled for 7/15 cancelled.

## 2013-12-25 ENCOUNTER — Telehealth: Payer: Self-pay | Admitting: Internal Medicine

## 2013-12-25 NOTE — Telephone Encounter (Signed)
Left a message for patient to call back. 

## 2013-12-26 NOTE — Telephone Encounter (Signed)
Left a message for patient to call back. 

## 2013-12-27 NOTE — Telephone Encounter (Signed)
Spoke with patient and moved her to 01/04/14 at 1:30 pm with  Willette Cluster, NP

## 2014-01-02 ENCOUNTER — Encounter: Payer: Medicare HMO | Admitting: Internal Medicine

## 2014-01-03 ENCOUNTER — Ambulatory Visit: Payer: Commercial Managed Care - HMO | Admitting: Nurse Practitioner

## 2014-01-04 ENCOUNTER — Encounter: Payer: Self-pay | Admitting: Gastroenterology

## 2014-01-04 ENCOUNTER — Other Ambulatory Visit (INDEPENDENT_AMBULATORY_CARE_PROVIDER_SITE_OTHER): Payer: Commercial Managed Care - HMO

## 2014-01-04 ENCOUNTER — Ambulatory Visit (INDEPENDENT_AMBULATORY_CARE_PROVIDER_SITE_OTHER): Payer: Commercial Managed Care - HMO | Admitting: Gastroenterology

## 2014-01-04 ENCOUNTER — Ambulatory Visit: Payer: Commercial Managed Care - HMO | Admitting: Nurse Practitioner

## 2014-01-04 VITALS — BP 130/70 | HR 70 | Ht 64.0 in | Wt 240.4 lb

## 2014-01-04 DIAGNOSIS — R109 Unspecified abdominal pain: Secondary | ICD-10-CM | POA: Insufficient documentation

## 2014-01-04 DIAGNOSIS — R110 Nausea: Secondary | ICD-10-CM

## 2014-01-04 DIAGNOSIS — K5900 Constipation, unspecified: Secondary | ICD-10-CM | POA: Insufficient documentation

## 2014-01-04 LAB — CBC WITH DIFFERENTIAL/PLATELET
BASOS ABS: 0 10*3/uL (ref 0.0–0.1)
Basophils Relative: 0.4 % (ref 0.0–3.0)
EOS ABS: 0.1 10*3/uL (ref 0.0–0.7)
Eosinophils Relative: 2 % (ref 0.0–5.0)
HEMATOCRIT: 39.7 % (ref 36.0–46.0)
Hemoglobin: 13.3 g/dL (ref 12.0–15.0)
Lymphocytes Relative: 26.8 % (ref 12.0–46.0)
Lymphs Abs: 1.8 10*3/uL (ref 0.7–4.0)
MCHC: 33.6 g/dL (ref 30.0–36.0)
MCV: 95.4 fl (ref 78.0–100.0)
MONO ABS: 0.6 10*3/uL (ref 0.1–1.0)
MONOS PCT: 9.6 % (ref 3.0–12.0)
NEUTROS ABS: 4 10*3/uL (ref 1.4–7.7)
Neutrophils Relative %: 61.2 % (ref 43.0–77.0)
Platelets: 211 10*3/uL (ref 150.0–400.0)
RBC: 4.16 Mil/uL (ref 3.87–5.11)
RDW: 14.6 % (ref 11.5–15.5)
WBC: 6.6 10*3/uL (ref 4.0–10.5)

## 2014-01-04 LAB — COMPREHENSIVE METABOLIC PANEL
ALT: 26 U/L (ref 0–35)
AST: 30 U/L (ref 0–37)
Albumin: 3.7 g/dL (ref 3.5–5.2)
Alkaline Phosphatase: 50 U/L (ref 39–117)
BILIRUBIN TOTAL: 0.7 mg/dL (ref 0.2–1.2)
BUN: 14 mg/dL (ref 6–23)
CO2: 28 meq/L (ref 19–32)
CREATININE: 0.7 mg/dL (ref 0.4–1.2)
Calcium: 9.1 mg/dL (ref 8.4–10.5)
Chloride: 103 mEq/L (ref 96–112)
GFR: 82.43 mL/min (ref >60.00)
Glucose, Bld: 109 mg/dL — ABNORMAL HIGH (ref 70–99)
Potassium: 4.2 mEq/L (ref 3.5–5.1)
Sodium: 139 mEq/L (ref 135–145)
Total Protein: 7 g/dL (ref 6.0–8.3)

## 2014-01-04 LAB — IGA: IGA: 386 mg/dL — AB (ref 68–378)

## 2014-01-04 LAB — TSH: TSH: 1.41 u[IU]/mL (ref 0.35–4.50)

## 2014-01-04 LAB — AMYLASE: Amylase: 36 U/L (ref 27–131)

## 2014-01-04 LAB — LIPASE: Lipase: 31 U/L (ref 11.0–59.0)

## 2014-01-04 NOTE — Progress Notes (Signed)
Reviewed and agree. Considering lack of information, we have to proceed with CT scan as well as colonoscoly and Labs.

## 2014-01-04 NOTE — Progress Notes (Signed)
01/04/2014 Darlene Parker [id number] [birth year]   HISTORY OF PRESENT ILLNESS:   Patient is a 78 year old female with minimal PMH.  Her biggest medical problem is her back pain/DDD.  She is here to discuss colonoscopy but also has several other GI complaints.  She is a poor historian when it comes to details of her symptoms and any other evaluation that she's had performed.  She refused to fill out her new patient paperwork after arriving 30 minutes late for her new patient visit.  She says that her last colonoscopy was at least 10 years ago in [place].  She complains of diffuse abdominal pain that has been present for months.  She is afraid of having some type of tumor/cancer.  She has some constipation but says that she manages that mostly with eating fruits/vegetables and other fiber along with drinking a lot of fluids.  Denies seeing blood in her stool.  She denies weight loss and says that she is actually gaining weight.  Complains of intermittent nausea and some regurgitation of her food at times.  Denies dysphagia.  She thinks that she had an EGD performed at some point within the past year or two, maybe in [place], but cannot remember details of exactly where it was performed or who performed the procedure.  There is no abdominal imagining in EPIC.     Past Medical History  Diagnosis Date  . Arthritis     RA  . Scoliosis   . Hypertension   . Depression   . Anxiety   . Osteoporosis   . Movement disorder   . DDD (degenerative disc disease)    Past Surgical History  Procedure Laterality Date  . Appendectomy    . Cholecystectomy      reports that she has never smoked. She has never used smokeless tobacco. She reports that she drinks alcohol. She reports that she does not use illicit drugs. family history includes Emphysema in her sister; Heart disease in her sister. There is no history of Colon cancer or Rectal cancer. No Known Allergies    Outpatient Encounter  Prescriptions as of 01/04/2014  Medication Sig  . gabapentin (NEURONTIN) 300 MG capsule Take 1 capsule (300 mg total) by mouth 4 (four) times daily.  Marland Kitchen lisinopril (PRINIVIL,ZESTRIL) 20 MG tablet Take 1 tablet (20 mg total) by mouth daily.  . mirtazapine (REMERON) 15 MG tablet Take 1 tablet (15 mg total) by mouth at bedtime. Will refill for one month. Pt needs to see PCP Sigmund Hazel  . simvastatin (ZOCOR) 20 MG tablet TAKE 1 TABLET (20 MG TOTAL) BY MOUTH EVERY EVENING.  . traMADol (ULTRAM) 50 MG tablet take 1 tablet by mouth every 6 hours if needed  . [DISCONTINUED] clonazePAM (KLONOPIN) 0.5 MG tablet Take 1 tablet (0.5 mg total) by mouth 2 (two) times daily.  . [DISCONTINUED] hydrochlorothiazide (MICROZIDE) 12.5 MG capsule Take 1 by mouth daily as needed for swelling     REVIEW OF SYSTEMS  : All other systems reviewed and negative except where noted in the History of Present Illness.   PHYSICAL EXAM: BP 130/70  Pulse 70  Ht 5\' 4"  (1.626 m)  Wt 240 lb 6.4 oz (109.045 kg)  BMI 41.24 kg/m2 General: Well developed white female in no acute distress; tearful Head: Normocephalic and atraumatic Eyes:  Sclerae anicteric, conjunctiva pink. Ears: Normal auditory acuity Lungs: Clear throughout to auscultation Heart: Regular rate and rhythm Abdomen: Soft, non-distended.  Normal bowel sounds.  Diffuse  TTP > in the epigastrium/mid-abdomen without R/R/G. Rectal:  Will be performed at the time of colonoscopy. Musculoskeletal: Symmetrical with no gross deformities  Skin: No lesions on visible extremities Extremities: No edema  Neurological: Alert oriented x 4, grossly non-focal Psychological:  Alert and cooperative. Normal mood and affect  ASSESSMENT AND PLAN: -78 year old female with complaints of abdominal pain, intermittent nausea, and constipation:  Will schedule colonoscopy (last was reportedly at least 10 years ago).  Will check labs including CBC, CMP, TSH, amylase/lipase, and celiac labs.   Will check CT scan of the abdomen and pelvis for evaluation of her several abdominal complaints.  I suggested that she start taking daily Miralax, but she said that she has been doing ok with her dietary fiber/fluid.

## 2014-01-04 NOTE — Patient Instructions (Addendum)
You have been scheduled for a CT scan of the abdomen and pelvis at [facility] ([address]---this is in the same building as Press [facility]).   You are scheduled on 01/08/2014 at 1:30pm. You should arrive 15 minutes prior to your appointment time for registration. Please follow the written instructions below on the day of your exam:  WARNING: IF YOU ARE ALLERGIC TO IODINE/X-RAY DYE, PLEASE NOTIFY RADIOLOGY IMMEDIATELY AT [phone]! YOU WILL BE GIVEN A 13 HOUR PREMEDICATION PREP.  1) Do not eat or drink anything after 9:30am (4 hours prior to your test) 2) You have been given 2 bottles of oral contrast to drink. The solution may taste               better if refrigerated, but do NOT add ice or any other liquid to this solution. Shake             well before drinking.    Drink 1 bottle of contrast @ 11:30am (2 hours prior to your exam)  Drink 1 bottle of contrast @ 12:30pm (1 hour prior to your exam)  You may take any medications as prescribed with a small amount of water except for the following: Metformin, Glucophage, Glucovance, Avandamet, Riomet, Fortamet, Actoplus Met, Janumet, Glumetza or Metaglip. The above medications must be held the day of the exam AND 48 hours after the exam.  The purpose of you drinking the oral contrast is to aid in the visualization of your intestinal tract. The contrast solution may cause some diarrhea. Before your exam is started, you will be given a small amount of fluid to drink. Depending on your individual set of symptoms, you may also receive an intravenous injection of x-ray contrast/dye. Plan on being at [facility] for 30 minutes or long, depending on the type of exam you are having performed.  If you have any questions regarding your exam or if you need to reschedule, you may call the CT department at [phone] between the hours of 8:00 am and 5:00 pm,  Monday-Friday.  ________________________________________________________________________      Go to the basement for labs today  You have been scheduled for a colonoscopy. Please follow written instructions given to you at your visit today.  Please pick up your prep kit at the pharmacy within the next 1-3 days. If you use inhalers (even only as needed), please bring them with you on the day of your procedure. Your physician has requested that you go to www.startemmi.com and enter the access code given to you at your visit today. This web site gives a general overview about your procedure. However, you should still follow specific instructions given to you by our office regarding your preparation for the procedure.

## 2014-01-05 ENCOUNTER — Ambulatory Visit (INDEPENDENT_AMBULATORY_CARE_PROVIDER_SITE_OTHER): Payer: Commercial Managed Care - HMO | Admitting: Family Medicine

## 2014-01-05 ENCOUNTER — Telehealth: Payer: Self-pay

## 2014-01-05 VITALS — BP 148/88 | HR 97 | Temp 98.5°F | Resp 20 | Ht 64.0 in | Wt 239.0 lb

## 2014-01-05 DIAGNOSIS — R82998 Other abnormal findings in urine: Secondary | ICD-10-CM

## 2014-01-05 DIAGNOSIS — K2100 Gastro-esophageal reflux disease with esophagitis, without bleeding: Secondary | ICD-10-CM

## 2014-01-05 DIAGNOSIS — R300 Dysuria: Secondary | ICD-10-CM

## 2014-01-05 DIAGNOSIS — N952 Postmenopausal atrophic vaginitis: Secondary | ICD-10-CM

## 2014-01-05 DIAGNOSIS — R8281 Pyuria: Secondary | ICD-10-CM

## 2014-01-05 LAB — POCT URINALYSIS DIPSTICK
Bilirubin, UA: NEGATIVE
Blood, UA: NEGATIVE
Glucose, UA: NEGATIVE
Ketones, UA: NEGATIVE
Nitrite, UA: NEGATIVE
Protein, UA: NEGATIVE
Spec Grav, UA: <=1.005
Urobilinogen, UA: 0.2
pH, UA: 6.5

## 2014-01-05 LAB — POCT UA - MICROSCOPIC ONLY
Casts, Ur, LPF, POC: NEGATIVE
Crystals, Ur, HPF, POC: NEGATIVE
Mucus, UA: NEGATIVE
RBC, urine, microscopic: NEGATIVE
Yeast, UA: NEGATIVE

## 2014-01-05 MED ORDER — ESTRADIOL 0.1 MG/GM VA CREA: 1.0000 | TOPICAL_CREAM | Freq: Every day | VAGINAL | Status: DC

## 2014-01-05 MED ORDER — NITROFURANTOIN MONOHYD MACRO 100 MG PO CAPS: 100.0000 mg | ORAL_CAPSULE | Freq: Two times a day (BID) | ORAL | Status: DC

## 2014-01-05 NOTE — Progress Notes (Signed)
Subjective:  This chart was scribed for Elvina Sidle, MD by Carl Best, Medical Scribe. This patient was seen in Room 11 and the patient's care was started at 2:35 PM.   Patient ID: Darlene Parker, female    DOB: [birth year], 78 y.o.   MRN: [id number]  HPI HPI Comments: Darlene Parker is a 78 y.o. female with a history of GERD who presents to the [facility] complaining of a urinary tract infection and SOB.  The patient states that her SOB started a week ago.  She denies chest pain, increased pedal edema, and dizziness as associated symptoms.  The patient states that she does not take any medication to treat her breathing.  The patient states that when she gets hot she experiences a lot of stress and severe diaphoresis.    The patient states that her UTI started two weeks ago.  She states that she took OTC medication with no relief to her symptoms.  She states that she has tried Cipro in the past with no relief to her symptoms.    She states that her PCP is [name].    Past Medical History  Diagnosis Date  . Arthritis     RA  . Scoliosis   . Hypertension   . Depression   . Anxiety   . Osteoporosis   . Movement disorder   . DDD (degenerative disc disease)    Past Surgical History  Procedure Laterality Date  . Appendectomy    . Cholecystectomy     Family History  Problem Relation Age of Onset  . Colon cancer Neg Hx   . Rectal cancer Neg Hx   . Heart disease Sister   . Emphysema Sister     smoker   History   Social History  . Marital Status: Widowed    Spouse Name: N/A    Number of Children: 3  . Years of Education: 13   Occupational History  . retired    Social History Main Topics  . Smoking status: Never Smoker   . Smokeless tobacco: Never Used  . Alcohol Use: Yes     Comment: occ.  . Drug Use: No  . Sexual Activity: Not on file   Other Topics Concern  . Not on file   Social History Narrative   Patient is a widow and lives  alone.   Patient has three children.   Patient is retired.   Patient has a college education.   Patient is right handed.   Patient does drink soda rarely.   No Known Allergies  Review of Systems  Respiratory: Positive for shortness of breath.   Cardiovascular: Negative for chest pain and leg swelling.  Endocrine: Negative for polyuria.  Genitourinary: Positive for dysuria, urgency, decreased urine volume and difficulty urinating. Negative for frequency, hematuria, flank pain, enuresis and dyspareunia.  Musculoskeletal: Negative for joint swelling.     Objective:  Physical Exam  Nursing note and vitals reviewed. Constitutional: She is oriented to person, place, and time. She appears well-developed and well-nourished.  HENT:  Head: Normocephalic and atraumatic.  Eyes: EOM are normal.  Neck: Normal range of motion.  Cardiovascular: Normal rate, regular rhythm and normal heart sounds.   No murmur heard. Pulmonary/Chest: Effort normal and breath sounds normal. No respiratory distress. She has no decreased breath sounds. She has no wheezes. She has no rales.  Musculoskeletal: Normal range of motion.  Neurological: She is alert and oriented to person, place, and time.  Skin: Skin is warm and dry.  Psychiatric: She has a normal mood and affect. Her behavior is normal.   Results for orders placed in visit on 01/05/14  POCT URINALYSIS DIPSTICK      Result Value Ref Range   Color, UA yellow     Clarity, UA clear     Glucose, UA neg     Bilirubin, UA neg     Ketones, UA neg     Spec Grav, UA <=1.005     Blood, UA neg     pH, UA 6.5     Protein, UA neg     Urobilinogen, UA 0.2     Nitrite, UA neg     Leukocytes, UA small (1+)    POCT UA - MICROSCOPIC ONLY      Result Value Ref Range   WBC, Ur, HPF, POC 0-3     RBC, urine, microscopic neg     Bacteria, U Microscopic trace     Mucus, UA neg     Epithelial cells, urine per micros 0-1     Crystals, Ur, HPF, POC neg     Casts,  Ur, LPF, POC neg     Yeast, UA neg     I spent a fair amount of time face-to-face with patient and her son talking about importance of diet to lose weight  BP 148/88  Pulse 97  Temp(Src) 98.5 F (36.9 C) (Oral)  Resp 20  Ht 5\' 4"  (1.626 m)  Wt 239 lb (108.41 kg)  BMI 41.00 kg/m2  SpO2 92% Assessment & Plan:    I personally performed the services described in this documentation, which was scribed in my presence. The recorded information has been reviewed and is accurate.  Dysuria - Plan: POCT urinalysis dipstick, POCT UA - Microscopic Only, nitrofurantoin, macrocrystal-monohydrate, (MACROBID) 100 MG capsule, Urine culture  Pyuria - Plan: nitrofurantoin, macrocrystal-monohydrate, (MACROBID) 100 MG capsule, Urine culture  Atrophic vaginitis - Plan: estradiol (ESTRACE VAGINAL) 0.1 MG/GM vaginal cream  Gastroesophageal reflux disease with esophagitis  Signed, , MD

## 2014-01-07 LAB — URINE CULTURE: Colony Count: >=100000

## 2014-01-07 LAB — TISSUE TRANSGLUTAMINASE, IGA: Tissue Transglutaminase Ab, IgA: 8.4 U/mL (ref <20)

## 2014-01-08 ENCOUNTER — Other Ambulatory Visit: Payer: Commercial Managed Care - HMO

## 2014-01-14 ENCOUNTER — Ambulatory Visit: Payer: Commercial Managed Care - HMO | Admitting: Gastroenterology

## 2014-01-14 ENCOUNTER — Telehealth: Payer: Self-pay

## 2014-01-14 NOTE — Telephone Encounter (Signed)
Still experiencing burning with urination, urinary retention, denies fever, no nausea,  Has abdominal pain, but states this is chronic. She would like another antibiotic called in for her. i explained to her this medication would continue to work after she has finished the medication, this is something she was not aware of.  Please advise  I also made her aware she would not need a referal for [facility] to be seen for weight loss.

## 2014-01-14 NOTE — Telephone Encounter (Signed)
PATIENT WOULD LIKE A REFERRAL TO [facility]. PLEASE RETURN CALL AND ADVISE. THANK YOU! CB# [phone]

## 2014-01-14 NOTE — Telephone Encounter (Signed)
lmom for pt to cb.  Pt does not need a referral for this service.

## 2014-01-15 MED ORDER — FLUCONAZOLE 150 MG PO TABS: 150.0000 mg | ORAL_TABLET | Freq: Once | ORAL | Status: DC

## 2014-01-15 NOTE — Telephone Encounter (Signed)
i called in diflucan

## 2014-01-15 NOTE — Telephone Encounter (Signed)
Please advise 

## 2014-01-16 NOTE — Telephone Encounter (Signed)
Left detailed msg on phone for pt. Explained to cb with any questions or concerns.

## 2014-01-17 ENCOUNTER — Telehealth: Payer: Self-pay | Admitting: Neurology

## 2014-01-17 MED ORDER — TRAMADOL HCL 50 MG PO TABS: 50.0000 mg | ORAL_TABLET | Freq: Four times a day (QID) | ORAL | Status: DC | PRN

## 2014-01-17 NOTE — Telephone Encounter (Signed)
The patient called tonight for a refill on ultram after hours.

## 2014-02-05 ENCOUNTER — Ambulatory Visit: Payer: Commercial Managed Care - HMO | Admitting: Internal Medicine

## 2014-02-09 ENCOUNTER — Telehealth: Payer: Self-pay

## 2014-02-09 ENCOUNTER — Other Ambulatory Visit: Payer: Self-pay | Admitting: Family Medicine

## 2014-02-09 NOTE — Telephone Encounter (Signed)
Patient states she saw [name] this week and was prescribed a medication, but doesn't know the name of it. Her pharmacy is [facility] [address]	[place] have a prescription for her from this visit. Please advise at   [phone]

## 2014-02-11 NOTE — Telephone Encounter (Signed)
Pt states that she has a UTI again and said she spoke to [name] about getting a prescription for an abx. Advised pt to RTC.

## 2014-02-13 ENCOUNTER — Encounter: Payer: Commercial Managed Care - HMO | Admitting: Internal Medicine

## 2014-02-14 ENCOUNTER — Ambulatory Visit (INDEPENDENT_AMBULATORY_CARE_PROVIDER_SITE_OTHER): Payer: Commercial Managed Care - HMO | Admitting: Family Medicine

## 2014-02-14 VITALS — BP 130/82 | HR 114 | Temp 98.0°F | Resp 16 | Ht 62.0 in | Wt 237.0 lb

## 2014-02-14 DIAGNOSIS — N390 Urinary tract infection, site not specified: Secondary | ICD-10-CM

## 2014-02-14 DIAGNOSIS — R350 Frequency of micturition: Secondary | ICD-10-CM

## 2014-02-14 DIAGNOSIS — R300 Dysuria: Secondary | ICD-10-CM

## 2014-02-14 DIAGNOSIS — N3941 Urge incontinence: Secondary | ICD-10-CM

## 2014-02-14 LAB — POCT UA - MICROSCOPIC ONLY
Casts, Ur, LPF, POC: NEGATIVE
Crystals, Ur, HPF, POC: NEGATIVE
Yeast, UA: NEGATIVE

## 2014-02-14 LAB — POCT URINALYSIS DIPSTICK
BILIRUBIN UA: NEGATIVE
GLUCOSE UA: NEGATIVE
Ketones, UA: NEGATIVE
NITRITE UA: POSITIVE
Protein, UA: 30
Spec Grav, UA: 1.02
UROBILINOGEN UA: 1
pH, UA: 5.5

## 2014-02-14 MED ORDER — CIPROFLOXACIN HCL 500 MG PO TABS: 500.0000 mg | ORAL_TABLET | Freq: Two times a day (BID) | ORAL | Status: DC

## 2014-02-14 MED ORDER — CEPHALEXIN 500 MG PO CAPS: 500.0000 mg | ORAL_CAPSULE | Freq: Three times a day (TID) | ORAL | Status: DC

## 2014-02-14 NOTE — Patient Instructions (Signed)

## 2014-02-14 NOTE — Progress Notes (Addendum)
This chart was scribed for Ethelda Chick, MD by Luisa Dago, ED Scribe. This patient was seen in room 5 and the patient's care was started at 5:38 PM.  Subjective:    Patient ID: Genelle Bal, female    DOB: 16-Jan-1936, 78 y.o.   MRN: [id number]  02/14/2014  burning with urination and Urinary Frequency   HPI Daliyah Sramek is a 78 y.o. female with a hx of rheumatoid arthritis, movement disorder, and scoliosis. A full list of her past medical history is charted below.  She was last seen 01/05/14 for UTI symptoms, her urine culture was positive for Klebsiella. Her urine culture of 07/31/13 and 09/07/13 were negative, however, her 07/31/13 was positive.  She has had several visits in past year for urinary symptoms.    Today, pt presents to the office complaining of dysuria and increased urinary frequency. She states that her symptoms started a couple of weeks ago. Pt states that she experiences "burning" with urination. She also states that she is unable to sit through her whole dinner because of her increased frequency. She reports having to go to the bathroom approximately 10 times in an hour. Pt is also complaining of sleep disturbances secondary to the increased frequency. She reports having to get up "half a dozen times" at night. Pt says that she has a portable potty beside her bed because sometimes she can't make it to the bathroom in time. ccMs. Cashwell states that she has not seen a urologist here in [place] but in the past while she was living in [place] she has. Ms. Dowen is wearing padded underwear to prevent any "accidents", but says that it is very uncomfortable because she hasn't found any that fit her. Pt moved to [place] two years ago. She denies any fevers, leakage, chills, vaginal discharge, or diaphoresis.   Pt's urinary urgency has worsened and is starting to interfere with outings and social events.  Pt is also complaining of abdominal pain. She states that her pain has been there  for a while. Ms.Syverson states that [name] knows about her abdominal pain. She says that she had a CT scan scheduled but "she chickened out". Pt denies any exacerbation of her abdominal pain secondary to her UTI symptoms.  S/p GI consultation for abdominal pain.    Review of Systems  Constitutional: Negative for fever, chills, diaphoresis and fatigue.  Respiratory: Negative for chest tightness and shortness of breath.   Cardiovascular: Negative for chest pain, palpitations and leg swelling.  Gastrointestinal: Positive for abdominal pain. Negative for nausea and vomiting.  Genitourinary: Positive for dysuria, urgency and frequency. Negative for hematuria, flank pain, decreased urine volume, vaginal discharge, vaginal pain and pelvic pain.  Neurological: Negative for dizziness, syncope, light-headedness and headaches.    Past Medical History  Diagnosis Date  . Arthritis     RA  . Scoliosis   . Hypertension   . Depression   . Anxiety   . Osteoporosis   . Movement disorder   . DDD (degenerative disc disease)    Past Surgical History  Procedure Laterality Date  . Appendectomy    . Cholecystectomy     No Known Allergies Current Outpatient Prescriptions  Medication Sig Dispense Refill  . gabapentin (NEURONTIN) 300 MG capsule Take 1 capsule (300 mg total) by mouth 4 (four) times daily.  120 capsule  11  . lisinopril (PRINIVIL,ZESTRIL) 20 MG tablet Take 1 tablet (20 mg total) by mouth daily.  90 tablet  1  .  mirtazapine (REMERON) 15 MG tablet Take 1 tablet (15 mg total) by mouth at bedtime. Will refill for one month. Pt needs to see PCP Sigmund Hazel  30 tablet  0  . simvastatin (ZOCOR) 20 MG tablet TAKE 1 TABLET (20 MG TOTAL) BY MOUTH EVERY EVENING.  30 tablet  5  . traMADol (ULTRAM) 50 MG tablet Take 1 tablet (50 mg total) by mouth every 6 (six) hours as needed. Must last 28 days.  120 tablet  1  . cephALEXin (KEFLEX) 500 MG capsule Take 1 capsule (500 mg total) by mouth 3 (three)  times daily.  21 capsule  0  . ciprofloxacin (CIPRO) 500 MG tablet Take 1 tablet (500 mg total) by mouth 2 (two) times daily.  14 tablet  0  . estradiol (ESTRACE VAGINAL) 0.1 MG/GM vaginal cream Place 1 Applicatorful vaginally at bedtime.  42.5 g  12  . fluconazole (DIFLUCAN) 150 MG tablet Take 1 tablet (150 mg total) by mouth once.  1 tablet  0  . nitrofurantoin, macrocrystal-monohydrate, (MACROBID) 100 MG capsule Take 1 capsule (100 mg total) by mouth 2 (two) times daily.  10 capsule  0   No current facility-administered medications for this visit.   History   Social History  . Marital Status: Widowed    Spouse Name: N/A    Number of Children: 3  . Years of Education: 13   Occupational History  . retired    Social History Main Topics  . Smoking status: Never Smoker   . Smokeless tobacco: Never Used  . Alcohol Use: Yes     Comment: occ.  . Drug Use: No  . Sexual Activity: Not on file   Other Topics Concern  . Not on file   Social History Narrative   Patient is a widow and lives alone.   Patient has three children.   Patient is retired.   Patient has a college education.   Patient is right handed.   Patient does drink soda rarely.       Objective:    Triage vitals: BP 130/82  Pulse 114  Temp(Src) 98 F (36.7 C) (Oral)  Resp 16  Ht 5\' 2"  (1.575 m)  Wt 237 lb (107.502 kg)  BMI 43.34 kg/m2  SpO2 92%  Physical Exam  Nursing note and vitals reviewed. Constitutional: She appears well-developed and well-nourished. No distress.  Poorly groomed with food stains on clothing.  HENT:  Head: Normocephalic and atraumatic.  Eyes: Conjunctivae are normal. Right eye exhibits no discharge. Left eye exhibits no discharge.  Neck: Neck supple.  Cardiovascular: Regular rhythm and normal heart sounds.  Tachycardia present.  Exam reveals no gallop and no friction rub.   No murmur heard. Tachycardic at about 110  Pulmonary/Chest: Effort normal and breath sounds normal. No  respiratory distress.  Abdominal: Soft. She exhibits distension. She exhibits no mass. There is no tenderness. There is no rebound and no guarding.  No CVA tenderness  Musculoskeletal: She exhibits no edema and no tenderness.  Neurological: She is alert.  Skin: Skin is warm and dry. No rash noted. She is not diaphoretic.  Psychiatric: She has a normal mood and affect. Her behavior is normal. Thought content normal.   Results for orders placed in visit on 02/14/14  POCT URINALYSIS DIPSTICK      Result Value Ref Range   Color, UA yellow     Clarity, UA hazy     Glucose, UA neg     Bilirubin, UA neg  Ketones, UA neg     Spec Grav, UA 1.020     Blood, UA trace     pH, UA 5.5     Protein, UA 30     Urobilinogen, UA 1.0     Nitrite, UA positive     Leukocytes, UA small (1+)    POCT UA - MICROSCOPIC ONLY      Result Value Ref Range   WBC, Ur, HPF, POC 25-30     RBC, urine, microscopic 3-6     Bacteria, U Microscopic 1+     Mucus, UA trace     Epithelial cells, urine per micros 2-4     Crystals, Ur, HPF, POC neg     Casts, Ur, LPF, POC neg     Yeast, UA neg         Assessment & Plan:   1. Frequent urination   2. Frequent UTI   3. Urge incontinence   4. Dysuria    1. Dysuria and urinary urgency:  New. Send urine culture; treat with Bactrim bid.  RTC fever, flank pain, n/v. 2.  Urge Incontinence: worsening separately from acute dysuria symptoms.  Refer to urology for evaluation. 3. Recurrent UTIs: New.  Two to three documented UTIs in the past nine months; refer to urology.  5:50 PM--pt was offered Cipro but declined stating that it has not worked for her in the past. Will prescribe Keflex instead.   Meds ordered this encounter  Medications  . ciprofloxacin (CIPRO) 500 MG tablet    Sig: Take 1 tablet (500 mg total) by mouth 2 (two) times daily.    Dispense:  14 tablet    Refill:  0  . cephALEXin (KEFLEX) 500 MG capsule    Sig: Take 1 capsule (500 mg total) by mouth 3  (three) times daily.    Dispense:  21 capsule    Refill:  0    TO REPLACE CIPRO; DO NOT FILL CIPRO.    No Follow-up on file.   I personally performed the services described in this documentation, which was scribed in my presence. The recorded information has been reviewed and is accurate.  Nilda Simmer, M.D.  [facility] [address] [place], [place]  [phone] phone [phone] fax

## 2014-02-17 LAB — URINE CULTURE: Colony Count: >=100000

## 2014-02-22 ENCOUNTER — Telehealth: Payer: Self-pay | Admitting: *Deleted

## 2014-02-22 NOTE — Telephone Encounter (Signed)
Pt notified of results and states that she is better

## 2014-02-22 NOTE — Telephone Encounter (Signed)
Message copied by Blima Ledger on Fri Feb 22, 2014 10:52 AM ------      Message from: Ethelda Chick      Created: Fri Feb 22, 2014 10:09 AM       Call --- Urine culture + for UTI.  Antibiotic should adequately treat infection; how is she feeling? ------

## 2014-03-05 ENCOUNTER — Telehealth: Payer: Self-pay | Admitting: Neurology

## 2014-03-05 MED ORDER — GABAPENTIN 400 MG PO CAPS: 400.0000 mg | ORAL_CAPSULE | Freq: Four times a day (QID) | ORAL | Status: DC

## 2014-03-05 MED ORDER — GABAPENTIN 400 MG PO CAPS: 400.0000 mg | ORAL_CAPSULE | Freq: Three times a day (TID) | ORAL | Status: DC

## 2014-03-05 NOTE — Telephone Encounter (Signed)
Patient stated traMADol (ULTRAM) 50 MG tablet isn't helping with leg and back pain.  Questioning if dosage could be increased?  Please call before 11:30 and after 1:00, but not after 4:00 - 5:00.  May leave detailed message on vm.

## 2014-03-05 NOTE — Telephone Encounter (Signed)
I called patient. The patient indicates that she is having increased back pain. The Ultram tablets have changed shape, likely a different generic manufacturer. The patient will be increased on the gabapentin taking 400 mg 4 times daily. I'll try to work in sooner as the pain level seems to increase.

## 2014-03-14 ENCOUNTER — Telehealth: Payer: Self-pay | Admitting: Neurology

## 2014-03-14 NOTE — Telephone Encounter (Signed)
This patient has been to her dentist several times concerned about dental or facial pain. The dentist has not found any dental etiology of her pain. She will be seen through this office on 04/01/2014, we will address this issue at that time. The patient also reports chronic low back pain. The patient in the past has frequently wanted prescriptions for Ultram, she does not have a primary care doctor.

## 2014-03-27 ENCOUNTER — Telehealth: Payer: Self-pay | Admitting: Neurology

## 2014-03-27 ENCOUNTER — Other Ambulatory Visit: Payer: Self-pay | Admitting: Neurology

## 2014-03-27 NOTE — Telephone Encounter (Signed)
Rx has been signed and faxed  

## 2014-03-27 NOTE — Telephone Encounter (Signed)
Rx signed and faxed.

## 2014-03-27 NOTE — Telephone Encounter (Signed)
Patient is requesting a refill on Tramadol for pain.  She does not take Trazodone.  This request has already been forwarded to the provider for approval, and will be sent to the pharmacy once authorized by MD.  I called the patient back.  Got no answer.  Left message.

## 2014-03-27 NOTE — Telephone Encounter (Signed)
Patient called back and requested Rx for Trazodone be called in before 6:00 in order for [facility] to fill and deliver before they close.

## 2014-03-27 NOTE — Telephone Encounter (Signed)
Patient requesting refill of trazodone, states that she's in pain, please return call and advise.

## 2014-04-01 ENCOUNTER — Ambulatory Visit: Payer: Self-pay | Admitting: Neurology

## 2014-04-26 ENCOUNTER — Encounter (HOSPITAL_COMMUNITY): Payer: Self-pay | Admitting: Neurology

## 2014-04-26 ENCOUNTER — Inpatient Hospital Stay (HOSPITAL_COMMUNITY)
Admission: EM | Admit: 2014-04-26 | Discharge: 2014-04-29 | DRG: 242 | Disposition: A | Discharge status: Home Health | Payer: Medicare HMO | Attending: Internal Medicine | Admitting: Internal Medicine

## 2014-04-26 ENCOUNTER — Encounter (HOSPITAL_COMMUNITY)
Admission: EM | Disposition: A | Discharge status: Home Health | Payer: Self-pay | Source: Home / Self Care | Attending: Internal Medicine

## 2014-04-26 ENCOUNTER — Emergency Department (HOSPITAL_COMMUNITY): Payer: Medicare HMO

## 2014-04-26 DIAGNOSIS — M419 Scoliosis, unspecified: Secondary | ICD-10-CM | POA: Diagnosis not present

## 2014-04-26 DIAGNOSIS — K5900 Constipation, unspecified: Secondary | ICD-10-CM | POA: Diagnosis present

## 2014-04-26 DIAGNOSIS — R001 Bradycardia, unspecified: Secondary | ICD-10-CM | POA: Diagnosis present

## 2014-04-26 DIAGNOSIS — G250 Essential tremor: Secondary | ICD-10-CM | POA: Diagnosis present

## 2014-04-26 DIAGNOSIS — Z950 Presence of cardiac pacemaker: Secondary | ICD-10-CM

## 2014-04-26 DIAGNOSIS — R911 Solitary pulmonary nodule: Secondary | ICD-10-CM | POA: Diagnosis present

## 2014-04-26 DIAGNOSIS — E785 Hyperlipidemia, unspecified: Secondary | ICD-10-CM | POA: Diagnosis present

## 2014-04-26 DIAGNOSIS — I442 Atrioventricular block, complete: Secondary | ICD-10-CM | POA: Diagnosis not present

## 2014-04-26 DIAGNOSIS — I10 Essential (primary) hypertension: Secondary | ICD-10-CM | POA: Diagnosis present

## 2014-04-26 DIAGNOSIS — Z79899 Other long term (current) drug therapy: Secondary | ICD-10-CM

## 2014-04-26 DIAGNOSIS — R198 Other specified symptoms and signs involving the digestive system and abdomen: Secondary | ICD-10-CM | POA: Diagnosis present

## 2014-04-26 DIAGNOSIS — R0902 Hypoxemia: Secondary | ICD-10-CM | POA: Diagnosis not present

## 2014-04-26 DIAGNOSIS — M810 Age-related osteoporosis without current pathological fracture: Secondary | ICD-10-CM | POA: Diagnosis present

## 2014-04-26 DIAGNOSIS — R32 Unspecified urinary incontinence: Secondary | ICD-10-CM | POA: Diagnosis present

## 2014-04-26 DIAGNOSIS — R739 Hyperglycemia, unspecified: Secondary | ICD-10-CM | POA: Diagnosis not present

## 2014-04-26 DIAGNOSIS — R1084 Generalized abdominal pain: Secondary | ICD-10-CM | POA: Diagnosis present

## 2014-04-26 DIAGNOSIS — R0600 Dyspnea, unspecified: Secondary | ICD-10-CM

## 2014-04-26 DIAGNOSIS — M069 Rheumatoid arthritis, unspecified: Secondary | ICD-10-CM | POA: Diagnosis not present

## 2014-04-26 DIAGNOSIS — R251 Tremor, unspecified: Secondary | ICD-10-CM | POA: Diagnosis present

## 2014-04-26 DIAGNOSIS — I5032 Chronic diastolic (congestive) heart failure: Secondary | ICD-10-CM | POA: Diagnosis present

## 2014-04-26 DIAGNOSIS — I5031 Acute diastolic (congestive) heart failure: Secondary | ICD-10-CM | POA: Diagnosis not present

## 2014-04-26 HISTORY — DX: Solitary pulmonary nodule: R91.1

## 2014-04-26 HISTORY — PX: PERMANENT PACEMAKER INSERTION: SHX5480

## 2014-04-26 HISTORY — DX: Atrioventricular block, complete: I44.2

## 2014-04-26 HISTORY — DX: Essential (primary) hypertension: I10

## 2014-04-26 HISTORY — DX: Hyperglycemia, unspecified: R73.9

## 2014-04-26 HISTORY — DX: Generalized abdominal pain: R10.84

## 2014-04-26 HISTORY — DX: Chronic diastolic (congestive) heart failure: I50.32

## 2014-04-26 LAB — CBC WITH DIFFERENTIAL/PLATELET
Basophils Absolute: 0 10*3/uL (ref 0.0–0.1)
Basophils Relative: 0 % (ref 0–1)
EOS ABS: 0 10*3/uL (ref 0.0–0.7)
EOS PCT: 0 % (ref 0–5)
HEMATOCRIT: 41.6 % (ref 36.0–46.0)
Hemoglobin: 13.7 g/dL (ref 12.0–15.0)
LYMPHS PCT: 16 % (ref 12–46)
Lymphs Abs: 1.3 10*3/uL (ref 0.7–4.0)
MCH: 32.2 pg (ref 26.0–34.0)
MCHC: 32.9 g/dL (ref 30.0–36.0)
MCV: 97.7 fL (ref 78.0–100.0)
MONO ABS: 0.7 10*3/uL (ref 0.1–1.0)
Monocytes Relative: 9 % (ref 3–12)
Neutro Abs: 6.1 10*3/uL (ref 1.7–7.7)
Neutrophils Relative %: 75 % (ref 43–77)
PLATELETS: 202 10*3/uL (ref 150–400)
RBC: 4.26 MIL/uL (ref 3.87–5.11)
RDW: 15.1 % (ref 11.5–15.5)
WBC: 8.2 10*3/uL (ref 4.0–10.5)

## 2014-04-26 LAB — I-STAT TROPONIN, ED: Troponin i, poc: 0.05 ng/mL (ref 0.00–0.08)

## 2014-04-26 LAB — HEPATIC FUNCTION PANEL
ALBUMIN: 3.2 g/dL — AB (ref 3.5–5.2)
ALT: 36 U/L — AB (ref 0–35)
AST: 44 U/L — AB (ref 0–37)
Alkaline Phosphatase: 81 U/L (ref 39–117)
BILIRUBIN DIRECT: 0.6 mg/dL — AB (ref 0.0–0.3)
Indirect Bilirubin: 1.1 mg/dL — ABNORMAL HIGH (ref 0.3–0.9)
TOTAL PROTEIN: 6.8 g/dL (ref 6.0–8.3)
Total Bilirubin: 1.7 mg/dL — ABNORMAL HIGH (ref 0.3–1.2)

## 2014-04-26 LAB — URINALYSIS, ROUTINE W REFLEX MICROSCOPIC
Bilirubin Urine: NEGATIVE
Glucose, UA: NEGATIVE mg/dL
Ketones, ur: NEGATIVE mg/dL
LEUKOCYTES UA: NEGATIVE
NITRITE: NEGATIVE
Protein, ur: NEGATIVE mg/dL
SPECIFIC GRAVITY, URINE: 1.008 (ref 1.005–1.030)
Urobilinogen, UA: 0.2 mg/dL (ref 0.0–1.0)
pH: 6.5 (ref 5.0–8.0)

## 2014-04-26 LAB — I-STAT CHEM 8, ED
BUN: 27 mg/dL — AB (ref 6–23)
CREATININE: 0.9 mg/dL (ref 0.50–1.10)
Calcium, Ion: 1.04 mmol/L — ABNORMAL LOW (ref 1.13–1.30)
Chloride: 108 mEq/L (ref 96–112)
GLUCOSE: 119 mg/dL — AB (ref 70–99)
HCT: 45 % (ref 36.0–46.0)
HEMOGLOBIN: 15.3 g/dL — AB (ref 12.0–15.0)
Potassium: 3.6 mEq/L — ABNORMAL LOW (ref 3.7–5.3)
SODIUM: 143 meq/L (ref 137–147)
TCO2: 21 mmol/L (ref 0–100)

## 2014-04-26 LAB — HEMOGLOBIN A1C
Hgb A1c MFr Bld: 7.1 % — ABNORMAL HIGH (ref <5.7)
MEAN PLASMA GLUCOSE: 157 mg/dL — AB (ref <117)

## 2014-04-26 LAB — TSH: TSH: 2.26 u[IU]/mL (ref 0.350–4.500)

## 2014-04-26 LAB — MAGNESIUM: Magnesium: 2.2 mg/dL (ref 1.5–2.5)

## 2014-04-26 LAB — MRSA PCR SCREENING: MRSA by PCR: NEGATIVE

## 2014-04-26 LAB — URINE MICROSCOPIC-ADD ON

## 2014-04-26 LAB — I-STAT CG4 LACTIC ACID, ED: Lactic Acid, Venous: 2.13 mmol/L (ref 0.5–2.2)

## 2014-04-26 SURGERY — PERMANENT PACEMAKER INSERTION
Anesthesia: LOCAL

## 2014-04-26 MED ORDER — ATROPINE SULFATE 1 MG/ML IJ SOLN: 0.6000 mg | Freq: Once | INTRAMUSCULAR | Status: DC

## 2014-04-26 MED ORDER — SIMVASTATIN 20 MG PO TABS
20.0000 mg | ORAL_TABLET | Freq: Every day | ORAL | Status: DC
  Administered 2014-04-27 – 2014-04-29 (×3): 20 mg via ORAL
  Filled 2014-04-26 (×3): qty 1

## 2014-04-26 MED ORDER — SODIUM CHLORIDE 0.9 % IV SOLN: INTRAVENOUS | Status: DC

## 2014-04-26 MED ORDER — ATROPINE SULFATE 0.1 MG/ML IJ SOLN
INTRAMUSCULAR | Status: AC
  Administered 2014-04-26: 0.6 mg
  Filled 2014-04-26: qty 30

## 2014-04-26 MED ORDER — HEPARIN (PORCINE) IN NACL 2-0.9 UNIT/ML-% IJ SOLN
INTRAMUSCULAR | Status: AC
  Filled 2014-04-26: qty 500

## 2014-04-26 MED ORDER — ONDANSETRON HCL 4 MG/2ML IJ SOLN: 4.0000 mg | Freq: Four times a day (QID) | INTRAMUSCULAR | Status: DC | PRN

## 2014-04-26 MED ORDER — MIDAZOLAM HCL 5 MG/5ML IJ SOLN
INTRAMUSCULAR | Status: AC
  Filled 2014-04-26: qty 5

## 2014-04-26 MED ORDER — SODIUM CHLORIDE 0.9 % IJ SOLN
3.0000 mL | Freq: Two times a day (BID) | INTRAMUSCULAR | Status: DC
  Administered 2014-04-26 – 2014-04-29 (×5): 3 mL via INTRAVENOUS

## 2014-04-26 MED ORDER — ALBUTEROL SULFATE (2.5 MG/3ML) 0.083% IN NEBU
2.5000 mg | INHALATION_SOLUTION | Freq: Four times a day (QID) | RESPIRATORY_TRACT | Status: DC
  Administered 2014-04-26 – 2014-04-27 (×3): 2.5 mg via RESPIRATORY_TRACT
  Filled 2014-04-26 (×3): qty 3

## 2014-04-26 MED ORDER — MIRTAZAPINE 15 MG PO TABS
15.0000 mg | ORAL_TABLET | Freq: Every day | ORAL | Status: DC
  Administered 2014-04-26 – 2014-04-27 (×2): 15 mg via ORAL
  Filled 2014-04-26 (×4): qty 1

## 2014-04-26 MED ORDER — GABAPENTIN 400 MG PO CAPS
400.0000 mg | ORAL_CAPSULE | Freq: Four times a day (QID) | ORAL | Status: DC
  Administered 2014-04-26 – 2014-04-29 (×11): 400 mg via ORAL
  Filled 2014-04-26 (×13): qty 1

## 2014-04-26 MED ORDER — NYSTATIN 100000 UNIT/GM EX POWD
Freq: Two times a day (BID) | CUTANEOUS | Status: DC
  Administered 2014-04-26 – 2014-04-29 (×5): via TOPICAL
  Filled 2014-04-26: qty 15

## 2014-04-26 MED ORDER — LISINOPRIL 10 MG PO TABS
10.0000 mg | ORAL_TABLET | Freq: Once | ORAL | Status: AC
  Administered 2014-04-27: 10 mg via ORAL
  Filled 2014-04-26 (×2): qty 1

## 2014-04-26 MED ORDER — ACETAMINOPHEN 325 MG PO TABS: 325.0000 mg | ORAL_TABLET | ORAL | Status: DC | PRN

## 2014-04-26 MED ORDER — LIDOCAINE HCL (PF) 1 % IJ SOLN
INTRAMUSCULAR | Status: AC
  Filled 2014-04-26: qty 60

## 2014-04-26 MED ORDER — ACETAMINOPHEN 650 MG RE SUPP: 650.0000 mg | Freq: Four times a day (QID) | RECTAL | Status: DC | PRN

## 2014-04-26 MED ORDER — LISINOPRIL 20 MG PO TABS
20.0000 mg | ORAL_TABLET | Freq: Every day | ORAL | Status: DC
  Administered 2014-04-27 – 2014-04-29 (×3): 20 mg via ORAL
  Filled 2014-04-26 (×3): qty 1

## 2014-04-26 MED ORDER — ATROPINE SULFATE 0.1 MG/ML IJ SOLN
INTRAMUSCULAR | Status: AC
  Filled 2014-04-26: qty 10

## 2014-04-26 MED ORDER — CEFAZOLIN SODIUM-DEXTROSE 2-3 GM-% IV SOLR
2.0000 g | Freq: Four times a day (QID) | INTRAVENOUS | Status: AC
  Administered 2014-04-27 (×2): 2 g via INTRAVENOUS
  Filled 2014-04-26 (×3): qty 50

## 2014-04-26 MED ORDER — ACETAMINOPHEN 325 MG PO TABS: 650.0000 mg | ORAL_TABLET | Freq: Four times a day (QID) | ORAL | Status: DC | PRN

## 2014-04-26 MED ORDER — CEFAZOLIN SODIUM-DEXTROSE 2-3 GM-% IV SOLR
INTRAVENOUS | Status: AC
  Administered 2014-04-27: 2 g via INTRAVENOUS
  Filled 2014-04-26: qty 50

## 2014-04-26 MED ORDER — TRAMADOL HCL 50 MG PO TABS
50.0000 mg | ORAL_TABLET | Freq: Four times a day (QID) | ORAL | Status: DC | PRN
  Administered 2014-04-27 – 2014-04-28 (×2): 50 mg via ORAL
  Filled 2014-04-26 (×4): qty 1

## 2014-04-26 MED ORDER — FENTANYL CITRATE 0.05 MG/ML IJ SOLN
INTRAMUSCULAR | Status: AC
  Filled 2014-04-26: qty 2

## 2014-04-26 MED ORDER — ONDANSETRON HCL 4 MG PO TABS: 4.0000 mg | ORAL_TABLET | Freq: Four times a day (QID) | ORAL | Status: DC | PRN

## 2014-04-26 MED ORDER — CEFAZOLIN SODIUM-DEXTROSE 2-3 GM-% IV SOLR: 2.0000 g | INTRAVENOUS | Status: DC

## 2014-04-26 MED ORDER — SODIUM CHLORIDE 0.9 % IR SOLN
80.0000 mg | Status: DC
  Filled 2014-04-26: qty 2

## 2014-04-26 MED ORDER — CHLORHEXIDINE GLUCONATE 4 % EX LIQD: 60.0000 mL | Freq: Once | CUTANEOUS | Status: DC

## 2014-04-26 MED ORDER — FUROSEMIDE 10 MG/ML IJ SOLN
INTRAMUSCULAR | Status: AC
  Filled 2014-04-26: qty 8

## 2014-04-26 MED ORDER — POLYETHYLENE GLYCOL 3350 17 G PO PACK
17.0000 g | PACK | Freq: Every day | ORAL | Status: DC | PRN
  Filled 2014-04-26: qty 1

## 2014-04-26 MED ORDER — DOCUSATE SODIUM 100 MG PO CAPS
100.0000 mg | ORAL_CAPSULE | Freq: Two times a day (BID) | ORAL | Status: DC
  Administered 2014-04-27 – 2014-04-28 (×3): 100 mg via ORAL
  Filled 2014-04-26 (×7): qty 1

## 2014-04-26 MED ORDER — ASPIRIN EC 81 MG PO TBEC
81.0000 mg | DELAYED_RELEASE_TABLET | Freq: Every day | ORAL | Status: DC
  Administered 2014-04-26 – 2014-04-29 (×4): 81 mg via ORAL
  Filled 2014-04-26 (×5): qty 1

## 2014-04-26 NOTE — ED Notes (Signed)
Pt ready to go to cath lab holding.

## 2014-04-26 NOTE — Progress Notes (Signed)
I talked to son at length about ppm, that without the pacemaker she not survive very long and she would not be able to ambulate well if at all.  I described risk of pneumothorax also length of hospitalization.  Pt has agreed to procedure. Instructed son if he wished to discuss with MD prior to procedure he would need to come ASAP.

## 2014-04-26 NOTE — ED Notes (Signed)
Pt placed into gown and on monitor upon arrival to room. Pt monitored by blood pressure, pulse ox, and 12 lead. Pt placed on zoll pads. Pts EKG given to and signed by Dr. Radford Pax

## 2014-04-26 NOTE — H&P (Signed)
Darlene Parker is an 78 y.o. female.    Primary Cardiologist:new PCP:  Abbe Amsterdam, MD  Chief Complaint: abd pain for a week and today not feeling well  HPI: 78 year old female with hx of rheumatoid arthritis, movement disorder (essential tremor), and scoliosis presents in CHB.  HR 30s but stable BP.  She is uncomfortable, needing BM but no chest pain or SOB.  Her EKG from May 2015 with RBBB, LPFB and first degree AV block.  On Echo in Feb she was noted to have [place] and was referred to cardiology but she declined to be seen.    She denies syncope but increased abd. Pain.  This was her complaint when her echo was done in Feb.  And stated she has had for 2 years but reluctant for studies.  Currently wants to eat and have a BM.  She answers questions appropriately .  Her son has been notified.  Given .6mg  atropine without change in rhythm.   Labs are stable with K+ 3.6.  Previous Echo: 08/17/13:  Left ventricle: Abnormal septal motion The cavity size was normal. Wall thickness was increased in a pattern of mild LVH. Systolic function was normal. The estimated ejection fraction was in the range of 50% to 55%. - Aortic valve: Trivial regurgitation. - Left atrium: The atrium was mildly dilated. - Atrial septum: No defect or patent foramen ovale was identified. - Impressions: Wenkebach rhythm and long PR preclude diatolic evaluation but it is abnormal Impressions: - Wenkebach rhythm and long PR preclude diatolic evaluation but it is abnormal  Hx of CT chest in 06/2012 : Left upper lobe nodule is likely stable in size when report of 05/04/2012 is reviewed. Direct comparison with the CT images would be definitive. If stable, based on size, additional follow-up CT chest without contrast in 6 months is recommended  Past Medical History  Diagnosis Date  . Arthritis     RA  . Scoliosis   . Hypertension   . Depression   . Anxiety   . Osteoporosis   .  Movement disorder   . DDD (degenerative disc disease)   . CHB (complete heart block) 04/26/2014  . Abdominal wall symptom 04/26/2014    Past Surgical History  Procedure Laterality Date  . Appendectomy    . Cholecystectomy      Family History  Problem Relation Age of Onset  . Colon cancer Neg Hx   . Rectal cancer Neg Hx   . Heart disease Sister   . Emphysema Sister     smoker   Social History:  reports that she has never smoked. She has never used smokeless tobacco. She reports that she drinks alcohol. She reports that she does not use illicit drugs. lives at [facility] in independent living.  She has 3 children.  Allergies: No Known Allergies  Outpt. Meds. No current facility-administered medications on file prior to encounter.   Current Outpatient Prescriptions on File Prior to Encounter  Medication Sig Dispense Refill  . cephALEXin (KEFLEX) 500 MG capsule Take 1 capsule (500 mg total) by mouth 3 (three) times daily. 21 capsule 0  . ciprofloxacin (CIPRO) 500 MG tablet Take 1 tablet (500 mg total) by mouth 2 (two) times daily. 14 tablet 0  . estradiol (ESTRACE VAGINAL) 0.1 MG/GM vaginal cream Place 1 Applicatorful vaginally at bedtime. 42.5 g 12  . fluconazole (DIFLUCAN) 150 MG tablet Take 1 tablet (150 mg total) by mouth once. 1 tablet  0  . gabapentin (NEURONTIN) 400 MG capsule Take 1 capsule (400 mg total) by mouth 4 (four) times daily. 90 capsule 5  . lisinopril (PRINIVIL,ZESTRIL) 20 MG tablet Take 1 tablet (20 mg total) by mouth daily. 90 tablet 1  . mirtazapine (REMERON) 15 MG tablet Take 1 tablet (15 mg total) by mouth at bedtime. Will refill for one month. Pt needs to see PCP Sigmund Hazel 30 tablet 0  . nitrofurantoin, macrocrystal-monohydrate, (MACROBID) 100 MG capsule Take 1 capsule (100 mg total) by mouth 2 (two) times daily. 10 capsule 0  . simvastatin (ZOCOR) 20 MG tablet TAKE 1 TABLET (20 MG TOTAL) BY MOUTH EVERY EVENING. 30 tablet 5  . traMADol (ULTRAM) 50 MG  tablet take 1 tablet by mouth every 6 hours if needed 120 tablet 0    Results for orders placed or performed during the hospital encounter of 04/26/14 (from the past 48 hour(s))  CBC with Differential     Status: None   Collection Time: 04/26/14  3:25 PM  Result Value Ref Range   WBC 8.2 4.0 - 10.5 K/uL   RBC 4.26 3.87 - 5.11 MIL/uL   Hemoglobin 13.7 12.0 - 15.0 g/dL   HCT 72.0 94.7 - 09.6 %   MCV 97.7 78.0 - 100.0 fL   MCH 32.2 26.0 - 34.0 pg   MCHC 32.9 30.0 - 36.0 g/dL   RDW 28.3 66.2 - 94.7 %   Platelets 202 150 - 400 K/uL   Neutrophils Relative % 75 43 - 77 %   Neutro Abs 6.1 1.7 - 7.7 K/uL   Lymphocytes Relative 16 12 - 46 %   Lymphs Abs 1.3 0.7 - 4.0 K/uL   Monocytes Relative 9 3 - 12 %   Monocytes Absolute 0.7 0.1 - 1.0 K/uL   Eosinophils Relative 0 0 - 5 %   Eosinophils Absolute 0.0 0.0 - 0.7 K/uL   Basophils Relative 0 0 - 1 %   Basophils Absolute 0.0 0.0 - 0.1 K/uL  I-Stat Chem 8, ED     Status: Abnormal   Collection Time: 04/26/14  3:37 PM  Result Value Ref Range   Sodium 143 137 - 147 mEq/L   Potassium 3.6 (L) 3.7 - 5.3 mEq/L   Chloride 108 96 - 112 mEq/L   BUN 27 (H) 6 - 23 mg/dL   Creatinine, Ser 6.54 0.50 - 1.10 mg/dL   Glucose, Bld 650 (H) 70 - 99 mg/dL   Calcium, Ion 3.54 (L) 1.13 - 1.30 mmol/L   TCO2 21 0 - 100 mmol/L   Hemoglobin 15.3 (H) 12.0 - 15.0 g/dL   HCT 65.6 81.2 - 75.1 %  I-Stat CG4 Lactic Acid, ED     Status: None   Collection Time: 04/26/14  3:38 PM  Result Value Ref Range   Lactic Acid, Venous 2.13 0.5 - 2.2 mmol/L  I-Stat Troponin, ED (not at [facility])     Status: None   Collection Time: 04/26/14  3:46 PM  Result Value Ref Range   Troponin i, poc 0.05 0.00 - 0.08 ng/mL   Comment 3            Comment: Due to the release kinetics of cTnI, a negative result within the first hours of the onset of symptoms does not rule out myocardial infarction with certainty. If myocardial infarction is still suspected, repeat the test at appropriate  intervals.    Dg Chest Portable 1 View  04/26/2014   CLINICAL DATA:  Bradycardia  EXAM: PORTABLE CHEST - 1 VIEW  COMPARISON:  07/31/2013  FINDINGS: Cardiac shadow is enlarged. The lungs are clear bilaterally. No acute bony abnormality is seen.  IMPRESSION: No active disease.   Electronically Signed   By: Alcide Clever M.D.   On: 04/26/2014 15:38    ROS: General:no colds or fevers, no weight changes Skin:no rashes or ulcers HEENT:no blurred vision, no congestion CV:see HPI PUL:see HPI GI:no diarrhea + constipation but had BM this week denies melena, no indigestion, + abd pain per pt for 2 years GU:no hematuria, no dysuria, recent UTI MS:no joint pain, no claudication,  Neuro:no syncope, no lightheadedness Endo:no diabetes, no thyroid disease   Blood pressure 162/60, pulse 33, temperature 99.6 F (37.6 C), temperature source Oral, resp. rate 16, SpO2 92 %. PE: General:Pleasant but restless,  Mild distress Skin:Warm and dry, brisk capillary refill HEENT:normocephalic, sclera clear, mucus membranes moist Neck:supple, no JVD, no bruits  Heart:S1S2 slow, rate in 30s without murmur, gallup, rub or click Lungs:clear though somewhat diminished, without rales, rhonchi, or wheezes OJJ:KKXFG,HWEX, mild diffuse tenderness, + BS, do not palpate liver spleen or masses Ext:no lower ext edema, 2+ pedal pulses, 2+ radial pulses Neuro:alert and oriented X 3, MAE, follows commands, + facial symmetry, has movement disorder.    Assessment/Plan Principal Problem:   CHB (complete heart block) HR in 30's and external pads in place. Will admit, to CCU, plan PPM once we talk with family    Active Problems:   Abdominal pain, diffuse chronic for 2 years   Hypertension- controlled   Tremor-essential followed by Dr Anne Hahn   Constipation   Lung nodule seen on imaging study in 2014 will need follow up this admit once HR improved.  Hyperlipidemia on zocor   [facility] R Nurse Practitioner Certified [facility] Pager [phone] or after 5pm or weekends call [phone] 04/26/2014, 4:28 PM As above, patient seen and examined. Briefly she is a 78 year old female with past medical history of rheumatoid arthritis, movement disorder, hypertension with complete heart block. Electrocardiogram 11/11/2012 showed sinus rhythm with first-degree AV block, right bundle branch block and left posterior fascicular block. Echocardiogram in February 2015 showed normal LV function, mild left atrial enlargement and trace aortic insufficiency. Patient is a difficult historian. She is alert and oriented to place but not time. She states that for the past week she has had increased dyspnea on exertion and dizziness with exertion. She denies orthopnea, PND, pedal edema, chest pain or syncope. No fevers or chills. She does complain of abdominal pain which she states has been present for 2 years. She states personnel at [facility] green today stated she didn't look right. She was sent to the emergency room and noted to be in complete heart block. Electrocardiogram shows complete heart block; ventricular escape with a heart rate of 33. Potassium is 3.6. Troponin normal. Patient is on no AV nodal blocking agents. Patient will require permanent pacemaker. I discussed this with her. Her son is in route and we will discuss this further with him. We will proceed later today. Note her abdominal pain is chronic and can be evaluated later. She was noted to have a lung nodule on previous CAT scan in 2014 which will also need follow-up. Olga Millers

## 2014-04-26 NOTE — ED Notes (Signed)
Cardiology at bedside.

## 2014-04-26 NOTE — ED Notes (Addendum)
Per EMS- Pt comes from [facility] independent living; staff called for abdominal pain x 1 week; pt was adamant about not going to hospital after discovering HR 33. EMS had to talk with pt for 1 hr to convince her to come. No known hx of such. BP 176/88. Pt refusing IV. Is alert and oriented. Is very anxious and nervous.

## 2014-04-26 NOTE — ED Notes (Addendum)
Provided pt with incontinent care. Pt remains monitored by blood pressure, pulse ox, and 12 lead.

## 2014-04-26 NOTE — ED Provider Notes (Signed)
CSN: [id number]     Arrival date & time 04/26/14  1503 History   First MD Initiated Contact with Patient 04/26/14 1506     Chief Complaint  Patient presents with  . Bradycardia     (Consider location/radiation/quality/duration/timing/severity/associated sxs/prior Treatment) HPI Comments: 78 y/o female with history of HTN, HLD presenting with bradycardia. Assisted living facility called EMS for abdominal pain. Pt reports pain present for 2 years. Unable to provide new symptoms that prompted EMS call. She endorses nausea but no vomiting, diarrhea, fevers. EMS noted her to be bradycardic in 30s. BP 170s with sats 88% on RA. She endorses dizziness made worse with standing and walking as well as chest discomfort that has been present x 5 days.   The history is provided by the patient and the EMS personnel. No language interpreter was used.    Past Medical History  Diagnosis Date  . Arthritis     RA  . Scoliosis   . Hypertension   . Depression   . Anxiety   . Osteoporosis   . Movement disorder   . DDD (degenerative disc disease)    Past Surgical History  Procedure Laterality Date  . Appendectomy    . Cholecystectomy     Family History  Problem Relation Age of Onset  . Colon cancer Neg Hx   . Rectal cancer Neg Hx   . Heart disease Sister   . Emphysema Sister     smoker   History  Substance Use Topics  . Smoking status: Never Smoker   . Smokeless tobacco: Never Used  . Alcohol Use: Yes     Comment: occ.   OB History    No data available     Review of Systems  Constitutional: Negative for fever and diaphoresis.  Eyes: Negative for visual disturbance.  Respiratory: Positive for chest tightness and shortness of breath. Negative for cough.   Cardiovascular: Negative for palpitations and leg swelling.  Gastrointestinal: Positive for nausea and abdominal pain. Negative for vomiting and diarrhea.  Genitourinary: Negative for dysuria.  Musculoskeletal: Positive for back pain  (chronic, diffuse).  Neurological: Positive for dizziness. Negative for syncope, speech difficulty and headaches.  Hematological: Does not bruise/bleed easily.  All other systems reviewed and are negative.     Allergies  Review of patient's allergies indicates no known allergies.  Home Medications   Prior to Admission medications   Medication Sig Start Date End Date Taking? Authorizing Provider  cephALEXin (KEFLEX) 500 MG capsule Take 1 capsule (500 mg total) by mouth 3 (three) times daily. 02/14/14   Ethelda Chick, MD  ciprofloxacin (CIPRO) 500 MG tablet Take 1 tablet (500 mg total) by mouth 2 (two) times daily. 02/14/14   Ethelda Chick, MD  estradiol (ESTRACE VAGINAL) 0.1 MG/GM vaginal cream Place 1 Applicatorful vaginally at bedtime. 01/05/14   Elvina Sidle, MD  fluconazole (DIFLUCAN) 150 MG tablet Take 1 tablet (150 mg total) by mouth once. 01/15/14   Elvina Sidle, MD  gabapentin (NEURONTIN) 400 MG capsule Take 1 capsule (400 mg total) by mouth 4 (four) times daily. 03/05/14   York Spaniel, MD  lisinopril (PRINIVIL,ZESTRIL) 20 MG tablet Take 1 tablet (20 mg total) by mouth daily.    Godfrey Pick, PA-C  mirtazapine (REMERON) 15 MG tablet Take 1 tablet (15 mg total) by mouth at bedtime. Will refill for one month. Pt needs to see PCP Sigmund Hazel 05/28/13   Nilda Riggs, NP  nitrofurantoin, macrocrystal-monohydrate, (MACROBID) 100 MG  capsule Take 1 capsule (100 mg total) by mouth 2 (two) times daily. 01/05/14   Elvina Sidle, MD  simvastatin (ZOCOR) 20 MG tablet TAKE 1 TABLET (20 MG TOTAL) BY MOUTH EVERY EVENING. 05/02/13   York Spaniel, MD  traMADol Janean Sark) 50 MG tablet take 1 tablet by mouth every 6 hours if needed 03/27/14   York Spaniel, MD   BP 130/93 mmHg  Pulse 33  Temp(Src) 99.6 F (37.6 C) (Oral)  Resp 18  SpO2 94% Physical Exam  Constitutional: She is oriented to person, place, and time. She appears well-developed.  HENT:  Head: Normocephalic.   Eyes: Pupils are equal, round, and reactive to light.  Neck: No JVD present.  Cardiovascular: Regular rhythm, normal heart sounds and intact distal pulses.  Bradycardia present.   Pulmonary/Chest: Breath sounds normal. Tachypnea noted.  Abdominal: Soft. She exhibits no distension and no mass. There is tenderness (diffuse). There is no rebound.  Musculoskeletal: She exhibits no edema.  Neurological: She is alert and oriented to person, place, and time.  Skin: Skin is warm.  Psychiatric: Her mood appears anxious.  Vitals reviewed.   ED Course  Procedures (including critical care time) Labs Review Labs Reviewed  I-STAT TROPOININ, ED  I-STAT CHEM 8, ED  I-STAT CG4 LACTIC ACID, ED    Imaging Review Dg Chest Portable 1 View  04/26/2014   CLINICAL DATA:  Bradycardia  EXAM: PORTABLE CHEST - 1 VIEW  COMPARISON:  07/31/2013  FINDINGS: Cardiac shadow is enlarged. The lungs are clear bilaterally. No acute bony abnormality is seen.  IMPRESSION: No active disease.   Electronically Signed   By: Alcide Clever M.D.   On: 04/26/2014 15:38     EKG Interpretation   Date/Time:  Friday April 26 2014 15:07:58 EST Ventricular Rate:  33 PR Interval:    QRS Duration: 205 QT Interval:  678 QTC Calculation: 502 R Axis:   101 Text Interpretation:  Complete AV block with wide QRS complex Nonspecific  intraventricular conduction delay Abnormal ekg Confirmed by BEATON  MD,  ROBERT ([id number]) on 04/26/2014 3:31:11 PM      MDM   Final diagnoses:  Bradycardia    78 y/o female with bradycardia and abdominal pain. H/o HTN but no beta blocker use on review of medications. Notes dizziness with exertion x 5 days. BP in 170s with HR 30s. EKG showing 3rd degree AV block, priors show 1st degree. Cardiology consulted and taking for pacemaker placement. No deterioration prior to transport.     Abagail Kitchens, MD 04/26/14 2349  Nelia Shi, MD 04/27/14 [phone]

## 2014-04-26 NOTE — ED Notes (Signed)
Pt requesting need to use bathroom for bowel movement. Pt made aware cannot get up at this time due to low HR and pt being weak. Pt offered bed pan multiple times and encouraged usage.

## 2014-04-26 NOTE — H&P (Signed)
HPI: 78 year old female with hx of rheumatoid arthritis, movement disorder (essential tremor), and scoliosis presents in CHB. HR 30s but stable BP. She is uncomfortable, needing BM but no chest pain or SOB. Her EKG from May 2015 with RBBB, LPFB and first degree AV block. On Echo in Feb she was noted to have [place] and was referred to cardiology but she declined to be seen.   She denies syncope but increased abd. Pain. This was her complaint when her echo was done in Feb. And stated she has had for 2 years but reluctant for studies. Currently wants to eat and have a BM. She answers questions appropriately . Her son has been notified. Given .6mg  atropine without change in rhythm. Labs are stable with K+ 3.6.  Previous Echo: 08/17/13: Left ventricle: Abnormal septal motion The cavity size was normal. Wall thickness was increased in a pattern of mild LVH. Systolic function was normal. The estimated ejection fraction was in the range of 50% to 55%. - Aortic valve: Trivial regurgitation. - Left atrium: The atrium was mildly dilated. - Atrial septum: No defect or patent foramen ovale was identified. - Impressions: Wenkebach rhythm and long PR preclude diatolic evaluation but it is abnormal Impressions: - Wenkebach rhythm and long PR preclude diatolic evaluation but it is abnormal  Hx of CT chest in 06/2012 : Left upper lobe nodule is likely stable in size when report of 05/04/2012 is reviewed. Direct comparison with the CT images would be definitive. If stable, based on size, additional follow-up CT chest without contrast in 6 months is recommended  Past Medical History  Diagnosis Date  . Arthritis     RA  . Scoliosis   . Hypertension   . Depression   . Anxiety   . Osteoporosis   . Movement disorder   . DDD (degenerative disc disease)   . CHB (complete heart block) 04/26/2014  . Abdominal wall symptom 04/26/2014     Past Surgical History  Procedure Laterality Date  . Appendectomy    . Cholecystectomy      Family History  Problem Relation Age of Onset  . Colon cancer Neg Hx   . Rectal cancer Neg Hx   . Heart disease Sister   . Emphysema Sister     smoker   Social History:  reports that she has never smoked. She has never used smokeless tobacco. She reports that she drinks alcohol. She reports that she does not use illicit drugs. lives at [facility] in independent living. She has 3 children.  Allergies: No Known Allergies  Outpt. Meds. No current facility-administered medications on file prior to encounter.   Current Outpatient Prescriptions on File Prior to Encounter  Medication Sig Dispense Refill  . cephALEXin (KEFLEX) 500 MG capsule Take 1 capsule (500 mg total) by mouth 3 (three) times daily. 21 capsule 0  . ciprofloxacin (CIPRO) 500 MG tablet Take 1 tablet (500 mg total) by mouth 2 (two) times daily. 14 tablet 0  . estradiol (ESTRACE VAGINAL) 0.1 MG/GM vaginal cream Place 1 Applicatorful vaginally at bedtime. 42.5 g 12  . fluconazole (DIFLUCAN) 150 MG tablet Take 1 tablet (150 mg total) by mouth once. 1 tablet 0  . gabapentin (NEURONTIN) 400 MG capsule Take 1 capsule (400 mg total) by mouth 4 (four) times daily. 90 capsule 5  . lisinopril (PRINIVIL,ZESTRIL) 20 MG tablet Take 1 tablet (20 mg total) by mouth daily. 90 tablet 1  . mirtazapine (REMERON) 15 MG tablet Take 1 tablet (  15 mg total) by mouth at bedtime. Will refill for one month. Pt needs to see PCP Sigmund Hazel 30 tablet 0  . nitrofurantoin, macrocrystal-monohydrate, (MACROBID) 100 MG capsule Take 1 capsule (100 mg total) by mouth 2 (two) times daily. 10 capsule 0  . simvastatin (ZOCOR) 20 MG tablet TAKE 1 TABLET (20 MG TOTAL) BY MOUTH EVERY EVENING. 30 tablet 5  . traMADol (ULTRAM) 50 MG tablet take 1 tablet by mouth every 6 hours  if needed 120 tablet 0     Lab Results Last 48 Hours    Results for orders placed or performed during the hospital encounter of 04/26/14 (from the past 48 hour(s))  CBC with Differential Status: None   Collection Time: 04/26/14 3:25 PM  Result Value Ref Range   WBC 8.2 4.0 - 10.5 K/uL   RBC 4.26 3.87 - 5.11 MIL/uL   Hemoglobin 13.7 12.0 - 15.0 g/dL   HCT 93.8 18.2 - 99.3 %   MCV 97.7 78.0 - 100.0 fL   MCH 32.2 26.0 - 34.0 pg   MCHC 32.9 30.0 - 36.0 g/dL   RDW 71.6 96.7 - 89.3 %   Platelets 202 150 - 400 K/uL   Neutrophils Relative % 75 43 - 77 %   Neutro Abs 6.1 1.7 - 7.7 K/uL   Lymphocytes Relative 16 12 - 46 %   Lymphs Abs 1.3 0.7 - 4.0 K/uL   Monocytes Relative 9 3 - 12 %   Monocytes Absolute 0.7 0.1 - 1.0 K/uL   Eosinophils Relative 0 0 - 5 %   Eosinophils Absolute 0.0 0.0 - 0.7 K/uL   Basophils Relative 0 0 - 1 %   Basophils Absolute 0.0 0.0 - 0.1 K/uL  I-Stat Chem 8, ED Status: Abnormal   Collection Time: 04/26/14 3:37 PM  Result Value Ref Range   Sodium 143 137 - 147 mEq/L   Potassium 3.6 (L) 3.7 - 5.3 mEq/L   Chloride 108 96 - 112 mEq/L   BUN 27 (H) 6 - 23 mg/dL   Creatinine, Ser 8.10 0.50 - 1.10 mg/dL   Glucose, Bld 175 (H) 70 - 99 mg/dL   Calcium, Ion 1.02 (L) 1.13 - 1.30 mmol/L   TCO2 21 0 - 100 mmol/L   Hemoglobin 15.3 (H) 12.0 - 15.0 g/dL   HCT 58.5 27.7 - 82.4 %  I-Stat CG4 Lactic Acid, ED Status: None   Collection Time: 04/26/14 3:38 PM  Result Value Ref Range   Lactic Acid, Venous 2.13 0.5 - 2.2 mmol/L  I-Stat Troponin, ED (not at [facility]) Status: None   Collection Time: 04/26/14 3:46 PM  Result Value Ref Range   Troponin i, poc 0.05 0.00 - 0.08 ng/mL   Comment 3       Comment: Due to the release kinetics of cTnI, a negative result within the first hours of the onset of  symptoms does not rule out myocardial infarction with certainty. If myocardial infarction is still suspected, repeat the test at appropriate intervals.       Imaging Results (Last 48 hours)    Dg Chest Portable 1 View  04/26/2014 CLINICAL DATA: Bradycardia EXAM: PORTABLE CHEST - 1 VIEW COMPARISON: 07/31/2013 FINDINGS: Cardiac shadow is enlarged. The lungs are clear bilaterally. No acute bony abnormality is seen. IMPRESSION: No active disease. Electronically Signed By: Alcide Clever M.D. On: 04/26/2014 15:38     ROS: General:no colds or fevers, no weight changes Skin:no rashes or ulcers HEENT:no blurred vision, no congestion CV:see HPI PUL:see  HPI GI:no diarrhea + constipation but had BM this week denies melena, no indigestion, + abd pain per pt for 2 years GU:no hematuria, no dysuria, recent UTI MS:no joint pain, no claudication,  Neuro:no syncope, no lightheadedness Endo:no diabetes, no thyroid disease  Blood pressure 162/60, pulse 33, temperature 99.6 F (37.6 C), temperature source Oral, resp. rate 16, SpO2 92 %. PE: General:Pleasant but restless, Mild distress Skin:Warm and dry, brisk capillary refill HEENT:normocephalic, sclera clear, mucus membranes moist Neck:supple, no JVD, no bruits  Heart:S1S2 slow, rate in 30s without murmur, gallup, rub or click Lungs:clear though somewhat diminished, without rales, rhonchi, or wheezes KAJ:GOTLX,BWIO, mild diffuse tenderness, + BS, do not palpate liver spleen or masses Ext:no lower ext edema, 2+ pedal pulses, 2+ radial pulses Neuro:alert and oriented X 3, MAE, follows commands, + facial symmetry, has movement disorder.   Assessment/Plan Principal Problem:  CHB (complete heart block) HR in 30's and external pads in place. Will admit, to CCU, plan PPM once we talk with family   Active Problems:  Abdominal pain, diffuse chronic for 2 years  Hypertension- controlled  Tremor-essential followed by Dr  Anne Hahn  Constipation  Lung nodule seen on imaging study in 2014 will need follow up this admit once HR improved. Hyperlipidemia on zocor   I have discussed the risks/benefits/goals/expectations of PPM with the patient. She is not a great candidate for PPM but I fear placement of a temporary wire will result in lead dislodgement and not implanting a device until Monday, likely to result in her death. Will proceed with PPM insertion. She is quite ill.  [name]

## 2014-04-26 NOTE — Interval H&P Note (Signed)
History and Physical Interval Note:  04/26/2014 5:57 PM  Darlene Parker  has presented today for surgery, with the diagnosis of bradycardia  The various methods of treatment have been discussed with the patient and family. After consideration of risks, benefits and other options for treatment, the patient has consented to  Procedure(s): PERMANENT PACEMAKER INSERTION (N/A) as a surgical intervention .  The patient's history has been reviewed, patient examined, no change in status, stable for surgery.  I have reviewed the patient's chart and labs.  Questions were answered to the patient's satisfaction.     Lewayne Bunting

## 2014-04-26 NOTE — CV Procedure (Signed)
SURGEON:  Lewayne Bunting, MD     PREPROCEDURE DIAGNOSIS:  Symptomatic Bradycardia due to complete heart block    POSTPROCEDURE DIAGNOSIS:  Same as preprocedure diagnosis     PROCEDURES:   1. Pacemaker implantation.     INTRODUCTION: Darlene Parker is a 78 y.o. female  with a history of bradycardia who presents today for pacemaker implantation secondary to CHB with no reversible causes.  The patient reports intermittent episodes of dizziness over the past few months.  No reversible causes have been identified.  The patient therefore presents today for pacemaker implantation.     DESCRIPTION OF PROCEDURE:  Informed written consent was obtained, and   the patient was brought to the electrophysiology lab in a fasting state.  The patient required no sedation for the procedure today.  The patients left chest was prepped and draped in the usual sterile fashion by the EP lab staff. The skin overlying the left deltopectoral region was infiltrated with lidocaine for local analgesia.  A 4-cm incision was made over the left deltopectoral region.  A left subcutaneous pacemaker pocket was fashioned using a combination of sharp and blunt dissection. Electrocautery was required to assure hemostasis.    RA/RV Lead Placement: The left axillary vein was therefore directly visualized and cannulated.  Through the left axillary vein, a Medtronic [id number] (serial number [id number]) right atrial lead and a Medtronic [id number] (serial number [id number]) right ventricular lead were advanced with fluoroscopic visualization into the right atrial appendage and right ventricular apical septal positions respectively.  Initial atrial lead P- waves measured 1.5 mV with impedance of 576 ohms and a threshold of 1 V at 0.5 msec.  Right ventricular lead R-waves measured 6.5 mV with an impedance of 1200 ohms and a threshold of 1 V at 0.5 msec.  Both leads were secured to the pectoralis fascia using #2-0 silk over the suture sleeves.   Device  Placement:  The leads were then connected to a medtronic (serial number [id number]) pacemaker.  The pocket was irrigated with copious gentamicin solution.  The pacemaker was then placed into the pocket.  The pocket was then closed in 2 layers with 2.0 Vicryl suture for the subcutaneous and subcuticular layers.  Steri-Strips and a sterile dressing were then applied.  There were no early apparent complications.     CONCLUSIONS:   1. Successful implantation of a Medtronic dual-chamber pacemaker for symptomatic bradycardia due to complete heart block  2. No early apparent complications.           Lewayne Bunting, MD 04/26/2014 7:04 PM

## 2014-04-27 ENCOUNTER — Inpatient Hospital Stay (HOSPITAL_COMMUNITY): Payer: Medicare HMO

## 2014-04-27 LAB — CBC
HCT: 38.6 % (ref 36.0–46.0)
Hemoglobin: 12.8 g/dL (ref 12.0–15.0)
MCH: 31.8 pg (ref 26.0–34.0)
MCHC: 33.2 g/dL (ref 30.0–36.0)
MCV: 96 fL (ref 78.0–100.0)
PLATELETS: 245 10*3/uL (ref 150–400)
RBC: 4.02 MIL/uL (ref 3.87–5.11)
RDW: 15 % (ref 11.5–15.5)
WBC: 10.7 10*3/uL — AB (ref 4.0–10.5)

## 2014-04-27 LAB — BASIC METABOLIC PANEL
Anion gap: 14 (ref 5–15)
BUN: 26 mg/dL — ABNORMAL HIGH (ref 6–23)
CO2: 27 mEq/L (ref 19–32)
Calcium: 8 mg/dL — ABNORMAL LOW (ref 8.4–10.5)
Chloride: 102 mEq/L (ref 96–112)
Creatinine, Ser: 0.87 mg/dL (ref 0.50–1.10)
GFR, EST AFRICAN AMERICAN: 73 mL/min — AB (ref >90)
GFR, EST NON AFRICAN AMERICAN: 63 mL/min — AB (ref >90)
Glucose, Bld: 106 mg/dL — ABNORMAL HIGH (ref 70–99)
POTASSIUM: 3.5 meq/L — AB (ref 3.7–5.3)
SODIUM: 143 meq/L (ref 137–147)

## 2014-04-27 MED ORDER — POTASSIUM CHLORIDE CRYS ER 20 MEQ PO TBCR
40.0000 meq | EXTENDED_RELEASE_TABLET | Freq: Two times a day (BID) | ORAL | Status: DC
  Administered 2014-04-27 – 2014-04-28 (×3): 40 meq via ORAL
  Filled 2014-04-27 (×7): qty 2

## 2014-04-27 MED ORDER — FUROSEMIDE 10 MG/ML IJ SOLN
40.0000 mg | Freq: Once | INTRAMUSCULAR | Status: AC
  Administered 2014-04-27: 40 mg via INTRAVENOUS
  Filled 2014-04-27: qty 4

## 2014-04-27 MED ORDER — ALBUTEROL SULFATE (2.5 MG/3ML) 0.083% IN NEBU
2.5000 mg | INHALATION_SOLUTION | Freq: Two times a day (BID) | RESPIRATORY_TRACT | Status: DC
  Administered 2014-04-28: 2.5 mg via RESPIRATORY_TRACT
  Filled 2014-04-27 (×3): qty 3

## 2014-04-27 MED ORDER — CETYLPYRIDINIUM CHLORIDE 0.05 % MT LIQD
7.0000 mL | Freq: Two times a day (BID) | OROMUCOSAL | Status: DC
  Administered 2014-04-27 – 2014-04-29 (×5): 7 mL via OROMUCOSAL

## 2014-04-27 MED ORDER — INFLUENZA VAC SPLIT QUAD 0.5 ML IM SUSY
0.5000 mL | PREFILLED_SYRINGE | INTRAMUSCULAR | Status: DC
  Filled 2014-04-27: qty 0.5

## 2014-04-27 MED ORDER — ALBUTEROL SULFATE (2.5 MG/3ML) 0.083% IN NEBU: 2.5000 mg | INHALATION_SOLUTION | RESPIRATORY_TRACT | Status: DC | PRN

## 2014-04-27 NOTE — Progress Notes (Signed)
Echo Lab  2D Echocardiogram completed.  Otelia Hettinger L Lian Tanori, RDCS 04/27/2014 11:45 AM

## 2014-04-27 NOTE — Progress Notes (Signed)
ELECTROPHYSIOLOGY ROUNDING NOTE    Patient Name: Darlene Parker Date of Encounter: 04/27/2014    SUBJECTIVE:Patient sleeping but arousable this morning.  Denies chest pain or shortness of breath.  S/p PPM implant 04-26-14  Diuresed >2L yesterday with IV lasix.   TELEMETRY: Reviewed telemetry pt in sinus rhythm with ventricular pacing Filed Vitals:   04/27/14 0300 04/27/14 0400 04/27/14 0500 04/27/14 0600  BP: 155/85 160/82 159/95 164/79  Pulse: 78 71 72 68  Temp:  98.4 F (36.9 C)    TempSrc:  Oral    Resp: 19 21 16 13   Height:      Weight:   232 lb 12.9 oz (105.6 kg)   SpO2: 93% 92% 94% 92%    Intake/Output Summary (Last 24 hours) at 04/27/14 13/07/15 Last data filed at 04/27/14 0600  Gross per 24 hour  Intake    730 ml  Output   3225 ml  Net  -2495 ml    CURRENT MEDICATIONS: . albuterol  2.5 mg Nebulization Q6H  . antiseptic oral rinse  7 mL Mouth Rinse BID  . aspirin EC  81 mg Oral Daily  . atropine  0.6 mg Intravenous Once  . docusate sodium  100 mg Oral BID  . gabapentin  400 mg Oral QID  . [START ON 04/28/2014] Influenza vac split quadrivalent PF  0.5 mL Intramuscular Tomorrow-1000  . lisinopril  20 mg Oral Daily  . mirtazapine  15 mg Oral QHS  . nystatin   Topical BID  . simvastatin  20 mg Oral q1800  . sodium chloride  3 mL Intravenous Q12H    LABS: Basic Metabolic Panel:  Recent Labs  13/01/2014 1525 04/26/14 1537 04/27/14 0251  NA  --  143 143  K  --  3.6* 3.5*  CL  --  108 102  CO2  --   --  27  GLUCOSE  --  119* 106*  BUN  --  27* 26*  CREATININE  --  0.90 0.87  CALCIUM  --   --  8.0*  MG 2.2  --   --    Liver Function Tests:  Recent Labs  04/26/14 1525  AST 44*  ALT 36*  ALKPHOS 81  BILITOT 1.7*  PROT 6.8  ALBUMIN 3.2*  CBC:  Recent Labs  04/26/14 1525 04/26/14 1537 04/27/14 0251  WBC 8.2  --  10.7*  NEUTROABS 6.1  --   --   HGB 13.7 15.3* 12.8  HCT 41.6 45.0 38.6  MCV 97.7  --  96.0  PLT 202  --  245   Hemoglobin  A1C:  Recent Labs  04/26/14 1525  HGBA1C 7.1*   Thyroid Function Tests:  Recent Labs  04/26/14 1655  TSH 2.260    Radiology/Studies:  Dg Chest Portable 1 View  04/26/2014   CLINICAL DATA:  Bradycardia  EXAM: PORTABLE CHEST - 1 VIEW  COMPARISON:  07/31/2013  FINDINGS: Cardiac shadow is enlarged. The lungs are clear bilaterally. No acute bony abnormality is seen.  IMPRESSION: No active disease.   Electronically Signed   By: 09/28/2013 M.D.   On: 04/26/2014 15:38   Final result pending, leads in stable position.  PHYSICAL EXAM Chronically il appearing NAD HEENT: Unremarkable,[place], AT Neck:  6 JVD, no thyromegally Back:  No CVA tenderness Lungs:  Clear with no wheezes, rales, or rhonchi HEART:  Regular rate rhythm, no murmurs, no rubs, no clicks Abd:  soft, obese, positive bowel sounds, no organomegally, no rebound, no  guarding Ext:  2 plus pulses, no edema, no cyanosis, no clubbing Skin:  No rashes no nodules Neuro:  CN II through XII intact, motor grossly intact  DEVICE INTERROGATION: Device interrogation pending   Principal Problem:   CHB (complete heart block) Active Problems:   Hypertension   Tremor   Constipation   Lung nodule seen on imaging study   Abdominal pain, epigastric   Complete heart block   She is stable after PPM insertion. She still has some volume overload, despite diuresing 2 liters. Will give additional lasix today, transfer to tele, replete potassium and she can be discharged to nursing home tomorrow unless they won't take her until Monday.  Darlene Parker.D.

## 2014-04-27 NOTE — Plan of Care (Signed)
Problem: Consults Goal: Skin Care Protocol Initiated - if Braden Score 18 or less If consults are not indicated, leave blank or document N/A  Outcome: Progressing Goal: Nutrition Consult-if indicated Outcome: Not Applicable Date Met:  50/09/38 Goal: Diabetes Guidelines if Diabetic/Glucose > 140 If diabetic or lab glucose is > 140 mg/dl - Initiate Diabetes/Hyperglycemia Guidelines & Document Interventions  Outcome: Not Applicable Date Met:  18/29/93  Problem: Phase II Progression Outcomes Goal: Pacer site without bleeding/hematoma Outcome: Progressing Goal: No evidence of pacemaker malfunction Outcome: Progressing Goal: Hemodynamically stable Outcome: Not Progressing Goal: Pain controlled Outcome: Progressing Goal: Other Phase II Outcomes/Goals Outcome: Not Applicable Date Met:  71/69/67

## 2014-04-28 ENCOUNTER — Inpatient Hospital Stay (HOSPITAL_COMMUNITY): Payer: Medicare HMO

## 2014-04-28 LAB — PRO B NATRIURETIC PEPTIDE: Pro B Natriuretic peptide (BNP): 428.1 pg/mL (ref 0–450)

## 2014-04-28 MED ORDER — FUROSEMIDE 10 MG/ML IJ SOLN
40.0000 mg | Freq: Once | INTRAMUSCULAR | Status: AC
  Administered 2014-04-28: 40 mg via INTRAVENOUS
  Filled 2014-04-28: qty 4

## 2014-04-28 NOTE — Progress Notes (Signed)
Pt is getting out of bed without asking for assistance. I asked her to use call bell but she refuses. Bed alarm is on.

## 2014-04-28 NOTE — Progress Notes (Signed)
Pt arrived to the room, c/o minimal pain to affected arm, AAox3 oriented to the room. Pt educated regarding wearing lt arm in sling and refused to wear to affected side stating it was to uncomfortable. Family at the bedside and educated as well.

## 2014-04-28 NOTE — Evaluation (Signed)
Physical Therapy Evaluation Patient Details Name: Darlene Parker MRN: [id number] DOB: 1935-07-09 Today's Date: 04/28/2014   History of Present Illness  78 year old female with hx of rheumatoid arthritis, movement disorder (essential tremor), and scoliosis presents in CHB.  HR 30s but stable BP.  EKG from May 2015 with RBBB, LPFB and first degree AV block.  On Echo in Feb she was noted to have [place] and was referred to cardiology but she declined to be seen.  Pacemaker placed this admission.  Clinical Impression  Pt presents with moderate limitations to functional mobility related to weakness, limited use of LUE, and impaired cognition/confusion.  Unclear whether pt has baseline cognition changes or if changes are acute, possible related to decrease adherence to O2 use... Per patient, she lives at [facility], though unclear if Independent Living, ALF or SNF level of care at baseline.  Nevertheless, recommend she receive ST SNF services for postacute rehab.  Will initiate services in acute setting.     Follow Up Recommendations SNF (return to facility (Back to? SNF))    Equipment Recommendations  None recommended by PT    Recommendations for Other Services       Precautions / Restrictions Precautions Precautions: Fall;ICD/Pacemaker Restrictions Weight Bearing Restrictions: No      Mobility  Bed Mobility Overal bed mobility:  (at EOB on arriva)                Transfers Overall transfer level: Needs assistance Equipment used: Rolling walker (2 wheeled) Transfers: Sit to/from Stand Sit to Stand: Min assist         General transfer comment: cues to minimize movement of L UE s/p pacemaker placement  Ambulation/Gait Ambulation/Gait assistance: Min assist Ambulation Distance (Feet): 10 Feet Assistive device: Rolling walker (2 wheeled) Gait Pattern/deviations: Step-through pattern;Trunk flexed   Gait velocity interpretation: Below normal speed for  age/gender General Gait Details: walked across room to w/c for transport to xray, pt highly distractible, reaching for and rearranging items on counter, stopping to talk, needs frequent cues to redirect.    Stairs            Wheelchair Mobility    Modified Rankin (Stroke Patients Only)       Balance Overall balance assessment: Needs assistance Sitting-balance support: No upper extremity supported;Feet supported Sitting balance-Leahy Scale: Fair     Standing balance support: During functional activity;Bilateral upper extremity supported Standing balance-Leahy Scale: Fair                               Pertinent Vitals/Pain      Home Living Family/patient expects to be discharged to:: Skilled nursing facility Living Arrangements: Non-relatives/Friends               Additional Comments: From [facility] SNF    Prior Function Level of Independence: Needs assistance   Gait / Transfers Assistance Needed: unclear, supervision with rolling walker at least  ADL's / Homemaking Assistance Needed: unclear        Hand Dominance        Extremity/Trunk Assessment   Upper Extremity Assessment: Generalized weakness           Lower Extremity Assessment: Generalized weakness         Communication      Cognition Arousal/Alertness: Awake/alert Behavior During Therapy: Restless Overall Cognitive Status: History of cognitive impairments - at baseline Area of Impairment: Memory  General Comments: dementia? acute confusion? unclear    General Comments      Exercises        Assessment/Plan    PT Assessment Patient needs continued PT services  PT Diagnosis Difficulty walking   PT Problem List Decreased activity tolerance;Decreased balance;Decreased mobility;Decreased coordination;Decreased knowledge of use of DME;Decreased cognition;Decreased safety awareness;Cardiopulmonary status limiting activity;Obesity  PT  Treatment Interventions DME instruction;Gait training;Functional mobility training;Therapeutic activities;Therapeutic exercise;Balance training;Patient/family education   PT Goals (Current goals can be found in the Care Plan section) Acute Rehab PT Goals Patient Stated Goal: wants to go home, not be in hospital PT Goal Formulation: Patient unable to participate in goal setting Time For Goal Achievement: 05/12/14 Potential to Achieve Goals: Fair    Frequency Min 2X/week   Barriers to discharge        Co-evaluation               End of Session Equipment Utilized During Treatment: Gait belt Activity Tolerance: Patient tolerated treatment well Patient left: in chair;with nursing/sitter in room Nurse Communication: Mobility status         Time: 1430-1447 PT Time Calculation (min): 17 min   Charges:   PT Evaluation $Initial PT Evaluation Tier I: 1 Procedure PT Treatments $Therapeutic Activity: 8-22 mins   PT G Codes:          Dennis Bast 04/28/2014, 2:48 PM

## 2014-04-28 NOTE — Significant Event (Signed)
Dr Donnie Aho into see pt, informed MD of patient not wanting to wear O2 and O2 SATS dropping to 88% on RA. Will continue to encourage patient to wear O2 and monitor patient.

## 2014-04-28 NOTE — Progress Notes (Signed)
RT Note:  Pt has told staff to stay out of room.  RT explained there was a tx scheduled for her and she said "LEAVE THE ROOM"  RT will monitor.

## 2014-04-28 NOTE — Progress Notes (Signed)
Subjective:  Says that she wants to go home, is having oxygen desaturations when she is off of oxygen.  Objective:  Vital Signs in the last 24 hours: BP 132/60 mmHg  Pulse 89  Temp(Src) 97.4 F (36.3 C) (Oral)  Resp 18  Ht 5' (1.524 m)  Wt 104.2 kg (229 lb 11.5 oz)  BMI 44.86 kg/m2  SpO2 94%  Physical Exam: Obese elderly female somewhat plethoric with cyanotic lips Lungs:  Clear  Cardiac:  Regular rhythm, normal S1 and S2, no S3, no rub Extremities:  No edema present  Intake/Output from previous day: 11/07 0701 - 11/08 0700 In: 410 [P.O.:350; I.V.:60] Out: 2450 [Urine:2450]   Weight Filed Weights   04/26/14 1944 04/27/14 0500 04/28/14 0454  Weight: 108.9 kg (240 lb 1.3 oz) 105.6 kg (232 lb 12.9 oz) 104.2 kg (229 lb 11.5 oz)    Lab Results: Basic Metabolic Panel:  Recent Labs  25/42/70 1537 04/27/14 0251  NA 143 143  K 3.6* 3.5*  CL 108 102  CO2  --  27  GLUCOSE 119* 106*  BUN 27* 26*  CREATININE 0.90 0.87    CBC:  Recent Labs  04/26/14 1525 04/26/14 1537 04/27/14 0251  WBC 8.2  --  10.7*  NEUTROABS 6.1  --   --   HGB 13.7 15.3* 12.8  HCT 41.6 45.0 38.6  MCV 97.7  --  96.0  PLT 202  --  245    Telemetry: Paced ventricular rhythm  Assessment/Plan:  1. Recent pacemaker placement 2. Oxygen desaturations of uncertain cause  Recommendations:  Check BNP and chest x-ray. Watch saturations and try to wean off of oxygen     W. Ashley Royalty  MD [facility] Cardiology  04/28/2014, 11:36 AM

## 2014-04-29 ENCOUNTER — Encounter (HOSPITAL_COMMUNITY): Payer: Self-pay | Admitting: Physician Assistant

## 2014-04-29 ENCOUNTER — Telehealth: Payer: Self-pay | Admitting: Internal Medicine

## 2014-04-29 DIAGNOSIS — R1084 Generalized abdominal pain: Secondary | ICD-10-CM | POA: Diagnosis present

## 2014-04-29 DIAGNOSIS — R739 Hyperglycemia, unspecified: Secondary | ICD-10-CM | POA: Diagnosis present

## 2014-04-29 DIAGNOSIS — I10 Essential (primary) hypertension: Secondary | ICD-10-CM | POA: Diagnosis present

## 2014-04-29 DIAGNOSIS — I5032 Chronic diastolic (congestive) heart failure: Secondary | ICD-10-CM | POA: Diagnosis present

## 2014-04-29 MED ORDER — POTASSIUM CHLORIDE CRYS ER 20 MEQ PO TBCR: 40.0000 meq | EXTENDED_RELEASE_TABLET | ORAL | Status: DC

## 2014-04-29 NOTE — Progress Notes (Signed)
1700 discharge instructions given to pt 's son Barbara Cower . Verbalized understanding. Wheeled to lobby by NT

## 2014-04-29 NOTE — Care Management Note (Addendum)
    Page 1 of 2   04/29/2014     2:39:15 PM CARE MANAGEMENT NOTE 04/29/2014  Patient:  Darlene Parker,Darlene Parker   Account Number:  [id number]  Date Initiated:  04/29/2014  Documentation initiated by:  Donato Schultz  Subjective/Objective Assessment:   bradycardia, complete heart block     Action/Plan:   CM to follow for disposition needs   Anticipated DC Date:  04/29/2014   Anticipated DC Plan:  HOME W HOME HEALTH SERVICES  In-house referral  Clinical Social Worker      DC Associate Professor  CM consult      [facility] Choice  HOME HEALTH   Choice offered to / List presented to:  C-1 Patient        HH arranged  HH-1 RN  HH-10 DISEASE MANAGEMENT  HH-2 PT  HH-3 OT  HH-4 NURSE'S AIDE  HH-6 SOCIAL WORKER      HH agency  [facility]   Status of service:  Completed, signed off Medicare Important Message given?  YES (If response is "NO", the following Medicare IM given date fields will be blank) Date Medicare IM given:  04/29/2014 Medicare IM given by:  Zarah Carbon Date Additional Medicare IM given:   Additional Medicare IM given by:    Discharge Disposition:  HOME W HOME HEALTH SERVICES  Per UR Regulation:  Reviewed for med. necessity/level of care/duration of stay  If discussed at Long Length of Stay Meetings, dates discussed:    Comments:  Tyrece Vanterpool RN, BSN, MSHL, CCM  Nurse - Case Manager,  ([facility] [place])  [phone]  04/18/2014 Social:  From home:  [facility] PT/OT RECS:  SNF DME RECS:  None Patient refuses SNF and elects to return back to [facility] IL[facility] with HHS (CM notified SW/Donna of patients decision) Disposition:  Home with HHS:  RN, PT, OT, CNA, SW ([facility]/Stephanie notified)

## 2014-04-29 NOTE — Discharge Instructions (Signed)
You need to follow up with your primary doctor for the following: - evaluation of your chronic abdominal pain - your liver function tests were mildly abnormal - your CT of your chest in 2014 showed a pulmonary nodule that may require repeat imaging - your blood sugar was slightly elevated this admission - your urinalysis showed trace blood in it        Supplemental Discharge Instructions for  Pacemaker Patients  Activity No heavy lifting or vigorous activity with your left/right arm for 6 to 8 weeks.  Do not raise your left/right arm above your head for one week.  Gradually raise your affected arm as drawn below.             05/01/14                      05/02/14                    05/03/14                 05/04/14 __  NO DRIVING until cleared by your doctor  WOUND CARE - Keep the wound area clean and dry.  Do not get this area wet for one week. No showers for one week; you may shower on   05/06/14  . - The tape/steri-strips on your wound will fall off; do not pull them off.  No bandage is needed on the site.  DO  NOT apply any creams, oils, or ointments to the wound area. - If you notice any drainage or discharge from the wound, any swelling or bruising at the site, or you develop a fever > 101? F after you are discharged home, call the office at once.  Special Instructions - You are still able to use cellular telephones; use the ear opposite the side where you have your pacemaker/defibrillator.  Avoid carrying your cellular phone near your device. - When traveling through airports, show security personnel your identification card to avoid being screened in the metal detectors.  Ask the security personnel to use the hand wand. - Avoid arc welding equipment, MRI testing (magnetic resonance imaging), TENS units (transcutaneous nerve stimulators).  Call the office for questions about other devices. - Avoid electrical appliances that are in poor condition or are not properly  grounded. - Microwave ovens are safe to be near or to operate.

## 2014-04-29 NOTE — Evaluation (Signed)
Occupational Therapy Evaluation and Discharge Patient Details Name: Darlene Parker MRN: [id number] DOB: 17-Mar-1936 Today's Date: 04/29/2014    History of Present Illness 78 year old female with hx of rheumatoid arthritis, movement disorder (essential tremor), and scoliosis presents in CHB.  HR 30s but stable BP.  EKG from May 2015 with RBBB, LPFB and first degree AV block.  On Echo in Feb she was noted to have [place] and was referred to cardiology but she declined to be seen.  Pacemaker placed this admission.   Clinical Impression   This 78 yo female admitted and underwent above presents to acute OT with decreased balance, decreased mobility, decreased safety all affecting her ability to care for herself at the Mod I level she was pta. We are recommending follow up OT at SNF; however pt is refusing SNF; thus pt will need HHOT. Acute OT will sign off.    Follow Up Recommendations  SNF (however pt refusing so needs HHOT set up (Made CM aware))    Equipment Recommendations  None recommended by OT       Precautions / Restrictions Precautions Precautions: Fall;ICD/Pacemaker Restrictions Weight Bearing Restrictions: No      Mobility Bed Mobility               General bed mobility comments: Pt up in bathroom upon arrival  Transfers Overall transfer level: Needs assistance Equipment used:  (none) Transfers: Sit to/from Stand Sit to Stand: Supervision         General transfer comment: Pt using LUE as needed--not however raising it up over her head (which she does not need to be doing due to a newly placed pacer)--due to pt's decreased cognition and regards for safety she probably will not follow her precautions    Balance Overall balance assessment: Needs assistance Sitting-balance support: No upper extremity supported;Feet supported Sitting balance-Leahy Scale: Good     Standing balance support: Single extremity supported Standing balance-Leahy Scale: Fair Standing  balance comment: Holding onto furniture as she made her way from the bathroom back to her bed                            ADL Overall ADL's : Needs assistance/impaired Eating/Feeding: Independent;Sitting   Grooming: Supervision/safety;Standing   Upper Body Bathing: Sitting;Supervision/ safety;Set up   Lower Body Bathing: Minimal assistance (with S sit<>stand)   Upper Body Dressing : Set up;Supervision/safety;Sitting   Lower Body Dressing: Minimal assistance (with S sit<>stand)   Toilet Transfer: Supervision/safety;Ambulation   Toileting- Clothing Manipulation and Hygiene: Supervision/safety;Sit to/from stand                         Pertinent Vitals/Pain Pain Assessment: No/denies pain     Hand Dominance Right   Extremity/Trunk Assessment Upper Extremity Assessment Upper Extremity Assessment: Overall WFL for tasks assessed           Communication Communication Communication: No difficulties   Cognition Arousal/Alertness: Awake/alert Behavior During Therapy: Restless Overall Cognitive Status: No family/caregiver present to determine baseline cognitive functioning                 General Comments: Pt up in her bathroom when I arrived without AD (although there was a RW in her room and she reports she uses one at home), her telemetry box and wires were laying in the floor as well as her O2 tubing and there feces in the floor next to the  BSC (again pt was in the bathroom). Nursing in to give pt meds at end of my session with pt sitting on the EOB, the pt was unable to divide her attention between the nurse and a couple of questions I was asking her (ie: pt was answering my question and then laying down with the meds in her hand, as opposed staying sitting up and taking her meds that were in her hand while still answering my questions)              Home Living Family/patient expects to be discharged to:: Skilled nursing facility     Type of  Home: Independent living facility Home Access: Level entry     Home Layout: One level     Bathroom Shower/Tub: Walk-in shower;Door         Home Equipment: Walker - 2 wheels;Shower seat - built in;Grab bars - tub/shower;Grab bars - toilet;Hand held shower head   Additional Comments: From [facility]      Prior Functioning/Environment Level of Independence: Independent with assistive device(s) (per pt report)             OT Diagnosis: Generalized weakness;Cognitive deficits   OT Problem List: Decreased cognition;Decreased knowledge of precautions;Obesity      OT Goals(Current goals can be found in the care plan section) Acute Rehab OT Goals Patient Stated Goal: home not SNF  OT Frequency:     Barriers to D/C: Decreased caregiver support             End of Session Equipment Utilized During Treatment:  (none)  Activity Tolerance: Patient tolerated treatment well Patient left: in bed;with call bell/phone within reach;with nursing/sitter in room   Time: 1130-1141 OT Time Calculation (min): 11 min Charges:  OT General Charges $OT Visit: 1 Procedure OT Evaluation $Initial OT Evaluation Tier I: 1 Procedure OT Treatments $Self Care/Home Management : 8-22 mins  [name], [name] [phone] 04/29/2014, 1:25 PM

## 2014-04-29 NOTE — Progress Notes (Addendum)
Patient is awaiting following informatio before discharge: - Awaiting VS from this morning to be entered in computer, including oxygen level - have asked nursing to do - Pharmacy tech left a note in sticky notes that med rec still needs to be verified - have put in a request for pharmacist to reconcile. - Awaiting input from Dr. Ladona Ridgel re: snf. Patient came from independent living.  I will finish her DC when this is complete. Amaru Burroughs PA-C    Addendum: the above tasks are complete, except now awaiting input from social work/care management regarding SNF placement. The patient came from [facility] independent living and PT recommended short term SNF. Ronie Spies PA-C   Patient vehemently refuses SNF. I told her this is our recommendation in order to ensure her recovery goes well but she declines. Will dc to home. Rehmat Murtagh PA-C

## 2014-04-29 NOTE — Discharge Summary (Signed)
Discharge Summary   Patient ID: Darlene Parker MRN: [id number], DOB/AGE: 1935/12/24 78 y.o. Admit date: 04/26/2014 D/C date:     04/29/2014  Primary Care Provider: Abbe Amsterdam, MD Primary Cardiologist: Jens Som saw on admission, Pacemaker implanted by Dr. Ladona Ridgel  Primary Discharge Diagnoses:  1. Complete heart block s/p PPM 2. Acute diastolic CHF 3. HTN 4. Hyperglycemia A1C 7.1 but CBGs 106-119 in the hospital 5. Intermittent hypoxia  Secondary Discharge Diagnoses:  1. Rheumatoid arthritis 2. Scoliosis 3. HTN 4. Depression 5. Anxiety 6. Osteoporosis 7. DDD 8. Essential tremor 9. Pulmonary nodule on CT chest 06/2012  Hospital Course: Ms. Darlene Parker is a 78 y/o F with history of rheumatoid arthritis, movement disorder (essential tremor), and scoliosis who presented to [facility] 04/26/2014 with complete heart block.On Echo in February, she was noted to have [place] and was referred to cardiology but she declined to be seen.She had presented to the ED this admission with increased abdominal pain which was her complaint when her echo was done in February. She has had this for 2 years.  Apparently the personnel at [facility] had told her she didn't look well so sent her to the ED. She was found to have CHB with a ventricular escape of 33bpm. She was given atropine .6mg  without change in rhythm. Labs were stable. TSH was normal. She was on no AVN blocking agents. Of note her abdominal pain was felt to be chronic. She subsequently underwent pacemaker implantation later that day with Medtronic dual-chamber pacemaker. She tolerated this procedure without acute complication, however, she was felt to be volume overloaded due to acute diastolic CHF. She had intermittent desaturations with hypoxia so required IV Lasix for diuresis along with potassium repletion. CXR showed trace pleural effusions, early interstitial edema, patchy retrocardiac opacity question atelectasis versus early  pneumonia. She is afebrile. 2D echo 04/27/14: EF normal, akinesis of the inferior and inferoseptal myocardium, mild AI, grade 1 diastolic dysfunction, mild-mod dilated LA. This morning she feels better. She is not felt to require Lasix for home at this time. It is suspected that her K will return to normal (and not requiring supplementation) off of Lasix. POx on day of discharge was 92%. She is not to drive until cleared. Dr. Ladona Ridgel has seen and examined the patient today and feels she is stable for discharge. She is scheduled for a wound check on 11/19 at which her 3 month appt will be made. We have also scheduled her for a TOC CHF visit that day given her need for diuresis this admission.   The plan was for her to go to SNF but upon review of the chart, the patient lives at [facility] independent living. Dr. Ladona Ridgel recommended SNF if she would agree but the patient vehemently declined to consider. She stated she did not need rehab and insisted on discharge to home.  She was given instructions for the following: "You need to follow up with your primary doctor for the following: - evaluation of your chronic abdominal pain - your liver function tests were mildly abnormal - your CT of your chest in 2014 showed a pulmonary nodule that may require repeat imaging - your blood sugar was slightly elevated this admission (A1C was 7.1 but her blood sugars during admission were in the low 100s so it's not clear that this accurately reflects her average sugar) - your urinalysis showed trace blood in it" ---- along with standard post-pacer orders.    Discharge Vitals: Blood pressure 139/83, pulse 90,  temperature 98.4 F (36.9 C), temperature source Oral, resp. rate 18, height 5' (1.524 m), weight 227 lb 6.4 oz (103.148 kg), SpO2 92 %.  Labs: Lab Results  Component Value Date   WBC 10.7* 04/27/2014   HGB 12.8 04/27/2014   HCT 38.6 04/27/2014   MCV 96.0 04/27/2014   PLT 245 04/27/2014    Recent  Labs Lab 04/26/14 1525  04/27/14 0251  NA  --   < > 143  K  --   < > 3.5*  CL  --   < > 102  CO2  --   --  27  BUN  --   < > 26*  CREATININE  --   < > 0.87  CALCIUM  --   --  8.0*  PROT 6.8  --   --   BILITOT 1.7*  --   --   ALKPHOS 81  --   --   ALT 36*  --   --   AST 44*  --   --   GLUCOSE  --   < > 106*  < > = values in this interval not displayed.  Diagnostic Studies/Procedures   Dg Chest 2 View  04/28/2014   CLINICAL DATA:  Cough, dyspnea  EXAM: CHEST  2 VIEW  COMPARISON:  04/27/2014  FINDINGS: Dual lead left-sided pacer in place. Mild prominence of the cardiomediastinal silhouette is reidentified with central vascular congestion. A few peripheral interstitial Kerley B-lines are noted. Trace pleural effusions are present. There is patchy retrocardiac airspace opacity.  IMPRESSION: Low lung volumes with trace pleural effusions and possibly early interstitial edema.  Patchy retrocardiac opacity which could represent atelectasis although early pneumonia could appear similar. If symptoms persist, consider PA and lateral chest radiographs obtained at full inspiration when the patient is clinically able.   Electronically Signed   By: Christiana Pellant M.D.   On: 04/28/2014 15:25   Dg Chest 2 View  04/27/2014   CLINICAL DATA:  Pacemaker placement.  EXAM: CHEST  2 VIEW  COMPARISON:  04/26/2014  FINDINGS: Stable cardiopericardial enlargement. Stable aortic contours. There is a new pacer from the left, dual-chamber. Leads are in the apical right ventricle and right atrial appendage region. No pneumothorax or visible pleural effusion (posterior costophrenic sulci are excluded).  IMPRESSION: New dual-chamber pacer from the left.  No adverse findings.   Electronically Signed   By: Tiburcio Pea M.D.   On: 04/27/2014 10:28   Dg Chest Portable 1 View  04/26/2014   CLINICAL DATA:  Bradycardia  EXAM: PORTABLE CHEST - 1 VIEW  COMPARISON:  07/31/2013  FINDINGS: Cardiac shadow is enlarged. The lungs are  clear bilaterally. No acute bony abnormality is seen.  IMPRESSION: No active disease.   Electronically Signed   By: Alcide Clever M.D.   On: 04/26/2014 15:38    Discharge Medications   Current Discharge Medication List    CONTINUE these medications which have NOT CHANGED   Details  gabapentin (NEURONTIN) 300 MG capsule Take 300 mg by mouth 4 (four) times daily.     HYDROcodone-acetaminophen (NORCO/VICODIN) 5-325 MG per tablet Take 1 tablet by mouth 3 (three) times daily as needed for moderate pain.      lisinopril (PRINIVIL,ZESTRIL) 20 MG tablet Take 1 tablet (20 mg total) by mouth daily.     mirtazapine (REMERON) 15 MG tablet Take 15 mg by mouth daily.     pantoprazole (PROTONIX) 40 MG tablet Take 40 mg by mouth daily.  simvastatin (ZOCOR) 20 MG tablet Take 20 mg by mouth daily.    traMADol (ULTRAM) 50 MG tablet Take 50 mg by mouth every 6 (six) hours as needed for moderate pain.    trimethoprim (TRIMPEX) 100 MG tablet Take 100 mg by mouth daily.     estradiol (ESTRACE VAGINAL) 0.1 MG/GM vaginal cream Place 1 Applicatorful vaginally at bedtime.    Associated Diagnoses: Atrophic vaginitis      STOP taking these medications     nitrofurantoin, macrocrystal-monohydrate, (MACROBID) 100 MG capsule - completed course         Disposition   The patient will be discharged in stable condition to home. Discharge Instructions    Diet - low sodium heart healthy    Complete by:  As directed      Increase activity slowly    Complete by:  As directed   Please see attached sheet at the end of your After-Visit Summary for instructions on wound care, activity, and bathing.          Follow-up Information    Follow up with [facility].   Specialty:  Cardiology   Why:  05/09/14 at 12pm for wound check, then appointment with Ronie Spies PA-C at 1:30pm   Contact information:   [address], [address] [place] [address] [phone]        Follow up with Lewayne Bunting, MD.   Specialty:  Cardiology   Why:  The office will set you up with an appointment to see Dr. Ladona Ridgel in 3 months   Contact information:   [address] [place] [address] [phone]       Follow up with Abbe Amsterdam, MD.   Specialty:  Family Medicine   Why:  See "discharge instructions" below.   Contact information:   [address] [place] [address] [phone]         Duration of Discharge Encounter: Greater than 30 minutes including physician and PA time.  Signed, Kriste Basque Dunn PA-C 04/29/2014, 10:19 AM

## 2014-04-29 NOTE — Progress Notes (Signed)
Patient ID: Darlene Parker, female   DOB: 1936-01-30, 78 y.o.   MRN: [id number]    Patient Name: Darlene Parker Date of Encounter: 04/29/2014     Principal Problem:   CHB (complete heart block) Active Problems:   Hypertension   Tremor   Constipation   Lung nodule seen on imaging study   Abdominal pain, epigastric   Complete heart block    SUBJECTIVE  "I want to go home", denies chest pain, shortness of breath, or palpitations CURRENT MEDS . albuterol  2.5 mg Nebulization BID  . antiseptic oral rinse  7 mL Mouth Rinse BID  . aspirin EC  81 mg Oral Daily  . atropine  0.6 mg Intravenous Once  . docusate sodium  100 mg Oral BID  . gabapentin  400 mg Oral QID  . Influenza vac split quadrivalent PF  0.5 mL Intramuscular Tomorrow-1000  . lisinopril  20 mg Oral Daily  . mirtazapine  15 mg Oral QHS  . nystatin   Topical BID  . potassium chloride  40 mEq Oral BID  . simvastatin  20 mg Oral q1800  . sodium chloride  3 mL Intravenous Q12H    OBJECTIVE  Filed Vitals:   04/28/14 0454 04/28/14 0812 04/28/14 1406 04/29/14 0417  BP: 132/60  118/69   Pulse: 89  91   Temp: 97.4 F (36.3 C)  98.4 F (36.9 C)   TempSrc: Oral  Oral   Resp: 18  18   Height:      Weight: 229 lb 11.5 oz (104.2 kg)   227 lb 6.4 oz (103.148 kg)  SpO2: 92% 94% 87%     Intake/Output Summary (Last 24 hours) at 04/29/14 0751 Last data filed at 04/29/14 0417  Gross per 24 hour  Intake    600 ml  Output   1400 ml  Net   -800 ml   Filed Weights   04/27/14 0500 04/28/14 0454 04/29/14 0417  Weight: 232 lb 12.9 oz (105.6 kg) 229 lb 11.5 oz (104.2 kg) 227 lb 6.4 oz (103.148 kg)    PHYSICAL EXAM  General: Pleasant, NAD. Neuro: Alert and oriented X 3. Moves all extremities spontaneously. Psych: Normal affect. HEENT:  Normal  Neck: Supple without bruits or JVD. Lungs:  Resp regular and unlabored, CTA. Heart: RRR no s3, s4, or murmurs. Abdomen: Soft, non-tender, non-distended, BS + x 4.    Extremities: No clubbing, cyanosis or edema. DP/PT/Radials 2+ and equal bilaterally.  Accessory Clinical Findings  CBC  Recent Labs  04/26/14 1525 04/26/14 1537 04/27/14 0251  WBC 8.2  --  10.7*  NEUTROABS 6.1  --   --   HGB 13.7 15.3* 12.8  HCT 41.6 45.0 38.6  MCV 97.7  --  96.0  PLT 202  --  245   Basic Metabolic Panel  Recent Labs  04/26/14 1525 04/26/14 1537 04/27/14 0251  NA  --  143 143  K  --  3.6* 3.5*  CL  --  108 102  CO2  --   --  27  GLUCOSE  --  119* 106*  BUN  --  27* 26*  CREATININE  --  0.90 0.87  CALCIUM  --   --  8.0*  MG 2.2  --   --    Liver Function Tests  Recent Labs  04/26/14 1525  AST 44*  ALT 36*  ALKPHOS 81  BILITOT 1.7*  PROT 6.8  ALBUMIN 3.2*   No results for input(s): LIPASE, AMYLASE in  the last 72 hours. Cardiac Enzymes No results for input(s): CKTOTAL, CKMB, CKMBINDEX, TROPONINI in the last 72 hours. BNP Invalid input(s): POCBNP D-Dimer No results for input(s): DDIMER in the last 72 hours. Hemoglobin A1C  Recent Labs  04/26/14 1525  HGBA1C 7.1*   Fasting Lipid Panel No results for input(s): CHOL, HDL, LDLCALC, TRIG, CHOLHDL, LDLDIRECT in the last 72 hours. Thyroid Function Tests  Recent Labs  04/26/14 1655  TSH 2.260    TELE  Normal sinus rhythm with P synchronous ventricular pacing  Radiology/Studies  Dg Chest 2 View  04/28/2014   CLINICAL DATA:  Cough, dyspnea  EXAM: CHEST  2 VIEW  COMPARISON:  04/27/2014  FINDINGS: Dual lead left-sided pacer in place. Mild prominence of the cardiomediastinal silhouette is reidentified with central vascular congestion. A few peripheral interstitial Kerley B-lines are noted. Trace pleural effusions are present. There is patchy retrocardiac airspace opacity.  IMPRESSION: Low lung volumes with trace pleural effusions and possibly early interstitial edema.  Patchy retrocardiac opacity which could represent atelectasis although early pneumonia could appear similar. If symptoms  persist, consider PA and lateral chest radiographs obtained at full inspiration when the patient is clinically able.   Electronically Signed   By: Christiana Pellant M.D.   On: 04/28/2014 15:25   Dg Chest 2 View  04/27/2014   CLINICAL DATA:  Pacemaker placement.  EXAM: CHEST  2 VIEW  COMPARISON:  04/26/2014  FINDINGS: Stable cardiopericardial enlargement. Stable aortic contours. There is a new pacer from the left, dual-chamber. Leads are in the apical right ventricle and right atrial appendage region. No pneumothorax or visible pleural effusion (posterior costophrenic sulci are excluded).  IMPRESSION: New dual-chamber pacer from the left.  No adverse findings.   Electronically Signed   By: Tiburcio Pea M.D.   On: 04/27/2014 10:28   Dg Chest Portable 1 View  04/26/2014   CLINICAL DATA:  Bradycardia  EXAM: PORTABLE CHEST - 1 VIEW  COMPARISON:  07/31/2013  FINDINGS: Cardiac shadow is enlarged. The lungs are clear bilaterally. No acute bony abnormality is seen.  IMPRESSION: No active disease.   Electronically Signed   By: Alcide Clever M.D.   On: 04/26/2014 15:38    ASSESSMENT AND PLAN  1. Complete heart block 2. Status post pacemaker insertion 3. Acute diastolic heart failure, status post IV Lasix, secondary to #1 Discussion: The patient is stable for discharge to her skilled nursing facility. She is continue to have incontinence since being in the hospital which is chronic. She appears to be back to her baseline status. She will require typical follow-up. She will come back for incision check in 2 weeks, and see me back in 3 months.  Gregg Taylor,M.D.  04/29/2014 7:51 AM

## 2014-04-29 NOTE — Progress Notes (Signed)
SW intern and supervisor met with patient to check on her disposition as she is a resident of [facility]. Physical Therapy recommended SNF but pt declined. Pt stated that her son is very supportive and lives nearby and he can bring her grocery and get her medications.Pt will discharge today back to [facility] son will come to pick her up once he gets off work. No further needs, SW Intern signing off.  [employer], [place] Intern, [phone]

## 2014-04-29 NOTE — Telephone Encounter (Signed)
New message    TCM 7-10 days with Ronie Spies on  11/19 @ 1:30 pm

## 2014-04-30 NOTE — Telephone Encounter (Signed)
Pt states that we need to let her son know about her appointment with Ronie Spies PA on 05/09/14 at 130 PM . Son can be reach at the same pt's phone #  number after 4:00 PM

## 2014-04-30 NOTE — Telephone Encounter (Signed)
Patient contacted regarding discharge from [facility] on 04/29/14.  Patient understands to follow up with provider  Ronie Spies PA on 05/09/14 at 1:30 PM at [facility] [address] office. Patient understands discharge instructions? Yes  Patient understands medications and regiment?yes  Patient understands to bring all medications to this visit? yes  Pt wants for her son to be call for the appointment date and time. Pt states the call same phane number that I called her. I called pt's daughter unable to leave a message.

## 2014-05-01 NOTE — Telephone Encounter (Signed)
Pt's son is aware of appointment date and time.

## 2014-05-08 ENCOUNTER — Encounter: Payer: Self-pay | Admitting: Neurology

## 2014-05-08 ENCOUNTER — Telehealth: Payer: Self-pay | Admitting: Internal Medicine

## 2014-05-08 NOTE — Telephone Encounter (Signed)
New message     Referral for home health physical therapy was received delay in programing evaluation due to  schedule  on 11/17 patient  chest pain with nurse visit.

## 2014-05-09 ENCOUNTER — Encounter: Payer: Self-pay | Admitting: Physician Assistant

## 2014-05-09 ENCOUNTER — Ambulatory Visit (INDEPENDENT_AMBULATORY_CARE_PROVIDER_SITE_OTHER): Payer: Commercial Managed Care - HMO | Admitting: *Deleted

## 2014-05-09 ENCOUNTER — Ambulatory Visit (INDEPENDENT_AMBULATORY_CARE_PROVIDER_SITE_OTHER): Payer: Commercial Managed Care - HMO | Admitting: Physician Assistant

## 2014-05-09 ENCOUNTER — Telehealth: Payer: Self-pay | Admitting: Internal Medicine

## 2014-05-09 ENCOUNTER — Encounter: Payer: Self-pay | Admitting: Internal Medicine

## 2014-05-09 VITALS — BP 115/68 | HR 87 | Ht 60.0 in | Wt 226.6 lb

## 2014-05-09 DIAGNOSIS — I5032 Chronic diastolic (congestive) heart failure: Secondary | ICD-10-CM

## 2014-05-09 DIAGNOSIS — R739 Hyperglycemia, unspecified: Secondary | ICD-10-CM

## 2014-05-09 DIAGNOSIS — I442 Atrioventricular block, complete: Secondary | ICD-10-CM

## 2014-05-09 DIAGNOSIS — I10 Essential (primary) hypertension: Secondary | ICD-10-CM

## 2014-05-09 LAB — MDC_IDC_ENUM_SESS_TYPE_INCLINIC
Battery Impedance: 100 Ohm
Battery Voltage: 2.8 V
Brady Statistic AP VS Percent: 0 %
Brady Statistic AS VP Percent: 99 %
Brady Statistic AS VS Percent: 0 %
Date Time Interrogation Session: 20151119133346
Lead Channel Impedance Value: 519 Ohm
Lead Channel Pacing Threshold Amplitude: 0.5 V
Lead Channel Pacing Threshold Amplitude: 0.75 V
Lead Channel Pacing Threshold Pulse Width: 0.4 ms
Lead Channel Pacing Threshold Pulse Width: 0.4 ms
Lead Channel Sensing Intrinsic Amplitude: 4 mV
Lead Channel Setting Pacing Amplitude: 3.5 V
Lead Channel Setting Sensing Sensitivity: 2.8 mV
MDC IDC MSMT BATTERY REMAINING LONGEVITY: 110 mo
MDC IDC MSMT LEADCHNL RA IMPEDANCE VALUE: 492 Ohm
MDC IDC SET LEADCHNL RA PACING AMPLITUDE: 3.5 V
MDC IDC SET LEADCHNL RV PACING PULSEWIDTH: 0.4 ms
MDC IDC STAT BRADY AP VP PERCENT: 1 %

## 2014-05-09 NOTE — Progress Notes (Signed)
Wound check appointment. Steri-strips removed. Wound without redness or edema. Incision edges approximated, wound well healed. Normal device function. Thresholds, sensing, and impedances consistent with implant measurements. Device programmed at 3.5V/auto capture programmed on for extra safety margin until 3 month visit. Histogram distribution appropriate for patient and level of activity. 5 mode switches all < 1 minute.  1 high ventricular rates noted 5 seconds. Patient educated about wound care, arm mobility, lifting restrictions. ROV in 3 months with implanting physician.

## 2014-05-09 NOTE — Telephone Encounter (Signed)
New message     Referral for physical therapy received. Evaluation performance . patient declined services.

## 2014-05-09 NOTE — Patient Instructions (Signed)
YOU HAVE A FOLLOW UP WITH DR. Ladona Ridgel 07/30/14 @ 12 PM  DAYNA DUNN, PA HER RECOMMENDATION FOR YOU IS TO FOLLOW UP WITH YOUR PRIMARY CARE PHYSICIAN AS TO YOUR HOT FLASHES AS WELL AS OTHER GENERAL MEDICAL PROBLEMS.  NO CHANGES WERE MADE TODAY WITH YOUR MEDICATIONS

## 2014-05-09 NOTE — Progress Notes (Signed)
[address] [place], [place]  [address] Phone: [phone] Fax:  [phone]  Date:  05/09/2014   Patient ID:  Darlene Parker, DOB [birth year], MRN [id number]   PCP:  Abbe Amsterdam, MD  Cardiologist:  Jens Som saw on admission, but patient ultimately had CHB so Electrophysiologist:  Ladona Ridgel   History of Present Illness: Darlene Parker is a 78 y.o. female with history of RA, essential tremor, scoliosis, chronic abdominal pain and recent CHB s/p pacemaker who presents to clinic for post-hospital follow-up. She was admitted 11/6-11/9 from [facility] assisted living facility with CHB and ventricular rate of 33. She actually didn't realize there was anything wrong - a staff member had noticed she didn't look so good so she was taken to the hospital. She was on no AVN blocking agents. TSH was normal. Labs otherwise were nonacute. She did have intermittent desaturations with CXR concerning for acute diastolic CHF. This improved after short-term Lasix for diuresis. She was not felt to require Lasix at discharge. 2D echo 04/27/14: EF normal, akinesis of the inferior and inferoseptal myocardium, mild AI, grade 1 diastolic dysfunction, mild-mod dilated LA. PT recommended SNF for the patient but she vehemently declined to consider this and was felt capable to make this decision. She was instructed at discharge to f/u with PCP for chronic abdominal pain/abnormal LFTs, pulmonary nodule seen in 2014, blood sugar (A1C 7.1), and trace blood on UA. She has not yet done so.  She comes in today and is feeling better since discharge. No chest pain, dyspnea, LEE, near syncope or syncope. Her biggest complaint is history of hot flashes. These do not follow any particular pattern. She has been on estrogen supplementation for these. Pacemaker interrogation was normal at her wound check appointment today.  Recent Labs: 04/26/2014: ALT 36*; TSH 2.260 04/27/2014: Creatinine 0.87; Hemoglobin 12.8; Potassium  3.5* 04/28/2014: Pro B Natriuretic peptide (BNP) 428.1  Wt Readings from Last 3 Encounters:  05/09/14 226 lb 9.6 oz (102.785 kg)  04/29/14 227 lb 6.4 oz (103.148 kg)  02/14/14 237 lb (107.502 kg)     Past Medical History  Diagnosis Date  . Arthritis     RA  . Scoliosis   . Essential hypertension   . Depression   . Anxiety   . Osteoporosis   . Movement disorder     a. Tremors.  . DDD (degenerative disc disease)   . CHB (complete heart block)     a. s/p Medtronic pacemaker 04/2014.  Marland Kitchen Generalized abdominal discomfort   . Chronic diastolic CHF (congestive heart failure)     a. Dx 04/2014 at time of CHB.  Marland Kitchen Hyperglycemia   . Pulmonary nodule     a. Seen on CT 06/2012.    Current Outpatient Prescriptions  Medication Sig Dispense Refill  . BELVIQ 10 MG TABS Take 1 tablet by mouth 2 (two) times daily.  0  . estradiol (ESTRACE VAGINAL) 0.1 MG/GM vaginal cream Place 1 Applicatorful vaginally at bedtime. 42.5 g 12  . gabapentin (NEURONTIN) 300 MG capsule Take 300 mg by mouth 4 (four) times daily.  1  . HYDROcodone-acetaminophen (NORCO/VICODIN) 5-325 MG per tablet Take 1 tablet by mouth 3 (three) times daily as needed for moderate pain.   0  . lisinopril (PRINIVIL,ZESTRIL) 20 MG tablet Take 1 tablet (20 mg total) by mouth daily. 90 tablet 1  . mirtazapine (REMERON) 15 MG tablet Take 15 mg by mouth daily.  0  . pantoprazole (PROTONIX) 40 MG tablet Take  40 mg by mouth daily.  0  . simvastatin (ZOCOR) 20 MG tablet Take 20 mg by mouth daily.    . traMADol (ULTRAM) 50 MG tablet Take 50 mg by mouth every 6 (six) hours as needed for moderate pain.    Marland Kitchen trimethoprim (TRIMPEX) 100 MG tablet Take 100 mg by mouth daily.  1   No current facility-administered medications for this visit.    Allergies:   Review of patient's allergies indicates no known allergies.   Social History:  The patient  reports that she has never smoked. She has never used smokeless tobacco. She reports that she drinks  alcohol. She reports that she does not use illicit drugs.   Family History:  The patient's family history includes Emphysema in her sister; Heart disease in her sister. There is no history of Colon cancer or Rectal cancer.   ROS:  Please see the history of present illness.  All other systems reviewed and negative.   PHYSICAL EXAM:  VS:  BP 115/68 mmHg  Pulse 87  Ht 5' (1.524 m)  Wt 226 lb 9.6 oz (102.785 kg)  BMI 44.25 kg/m2 Well nourished, well developed obese  F, in no acute distress HEENT: normal Neck: no JVD Cardiac:  normal S1, S2; RRR; no murmur Lungs:  clear to auscultation bilaterally, no wheezing, rhonchi or rales Abd: soft, nontender, no hepatomegaly Ext: no edema Skin: warm and dry, left upper chest pacemaker site appears to be well healing without dehiscence Neuro:  moves all extremities spontaneously, no focal abnormalities noted, mildly tremulous, somewhat tangential in conversation  EKG:  Electronic ventricular pacemaker 87bpm (atrial sensed, V pacing)    ASSESSMENT AND PLAN:  1. Complete heart block s/p PPM 2. Chronic diastolic CHF in setting of #1, appears euvolemic 3. HTN, controlled 4. Hyperglycemia A1C 7.1 - likely diabetes (pt aware) 5. History of hot flashes  From a cardiac standpoint she is doing well. Pacemaker is functioning normally. No med changes made today. I encouraged follow-up with her primary doctor for her general medical problems (as above) as well as history of hot flashes. She will follow up per usual in 3 months post-pacer with her electrophysiologist. She verbalized understanding of plan.  Dispo: F/u Dr. Ladona Ridgel as scheduled in 07/2014.  Signed, Ronie Spies, PA-C  05/09/2014 1:46 PM

## 2014-05-14 ENCOUNTER — Encounter: Payer: Self-pay | Admitting: Neurology

## 2014-05-15 ENCOUNTER — Other Ambulatory Visit: Payer: Self-pay | Admitting: Neurology

## 2014-05-15 ENCOUNTER — Telehealth: Payer: Self-pay

## 2014-05-15 NOTE — Telephone Encounter (Signed)
Pt would like DR. COPLAND to write a referral letter to [facility] so she can get home health care, they are willing to give her home health, but it has to come through insurance Please call [phone]

## 2014-05-21 ENCOUNTER — Telehealth: Payer: Self-pay

## 2014-05-21 NOTE — Telephone Encounter (Signed)
Pt understands why she has an appt. She was transferred to scheduling to change her 12/21 appt to a slot after 3 pm due to transportation issues.

## 2014-05-21 NOTE — Telephone Encounter (Signed)
Pt is looking for a home health companion- Frances Furbish called to get referral for home health.  Lou Miner I would contact pt to come in for an appt.  Called pt and advised. Transferred pt to schedule an appt.

## 2014-05-21 NOTE — Telephone Encounter (Signed)
Patient wanted to ask Darlene Parker. Why she needs an appointment with [name] and if she called [name] yet for them to get authorization for home health? Please advise. I wasn't able to explain why she needed an ov with [name], i was not sure.   Best: [phone]

## 2014-05-28 ENCOUNTER — Other Ambulatory Visit: Payer: Self-pay | Admitting: Family Medicine

## 2014-05-28 DIAGNOSIS — R54 Age-related physical debility: Secondary | ICD-10-CM

## 2014-05-30 ENCOUNTER — Telehealth: Payer: Self-pay

## 2014-05-30 ENCOUNTER — Encounter (HOSPITAL_COMMUNITY): Payer: Self-pay | Admitting: Internal Medicine

## 2014-05-30 NOTE — Telephone Encounter (Signed)
Spoke to patient.  She has not had her flu shot yet.  She will try to get it soon.

## 2014-06-01 ENCOUNTER — Ambulatory Visit (INDEPENDENT_AMBULATORY_CARE_PROVIDER_SITE_OTHER): Payer: Commercial Managed Care - HMO | Admitting: Family Medicine

## 2014-06-01 VITALS — BP 126/82 | HR 86 | Temp 98.3°F | Resp 20 | Ht 60.0 in | Wt 235.1 lb

## 2014-06-01 DIAGNOSIS — R54 Age-related physical debility: Secondary | ICD-10-CM

## 2014-06-01 DIAGNOSIS — H542 Low vision, both eyes: Secondary | ICD-10-CM

## 2014-06-01 DIAGNOSIS — H543 Unqualified visual loss, both eyes: Secondary | ICD-10-CM

## 2014-06-01 DIAGNOSIS — G259 Extrapyramidal and movement disorder, unspecified: Secondary | ICD-10-CM

## 2014-06-01 NOTE — Progress Notes (Signed)
[facility] [address] [place] [address] [phone]  Date:  06/01/2014   Name:  Darlene Parker   DOB:  Jan 17, 1936   MRN:  [id number]  PCP:  Abbe Amsterdam, MD    Chief Complaint: Advice Only   History of Present Illness:  Darlene Parker is a 78 y.o. very pleasant female patient who presents with the following:  She is here today because she needs to be set up for home health.  She is covered under [name]. They told her that she could get a home health aid.  She needs help with light housekeeping, personal care like washing her hair and bathing.  She has low vision due to cataracts that makes it harder to do tasks at home.  She needs help with meal preparation, help getting to different rooms in her home.  She is able to organize her own medications- her son puts them in the pill box for her.   She lives alone in an apartment at [facility]- this is not assisted living. There is assisted living at her facility but she does not think she qualifies for this.  Her son does live here in town.   She was told that she needs a face to face evaluation to document the reasons for her home health aid.  She also suffers from chronic CHF, and uses a rolling walker  Patient Active Problem List   Diagnosis Date Noted  . Chronic diastolic CHF (congestive heart failure)   . Generalized abdominal discomfort   . Hyperglycemia   . Essential hypertension   . CHB (complete heart block) 04/26/2014  . Lung nodule seen on imaging study 04/26/2014  . Abdominal pain in female 01/04/2014  . Nausea alone 01/04/2014  . Constipation 01/04/2014  . Tremor 12/26/2012  . Insomnia 11/22/2010  . Chronic back pain 11/08/2010  . Urinary frequency 11/08/2010    Past Medical History  Diagnosis Date  . Arthritis     RA  . Scoliosis   . Essential hypertension   . Depression   . Anxiety   . Osteoporosis   . Movement disorder     a. Tremors.  . DDD (degenerative disc  disease)   . CHB (complete heart block)     a. s/p Medtronic pacemaker 04/2014.  Marland Kitchen Generalized abdominal discomfort   . Chronic diastolic CHF (congestive heart failure)     a. Dx 04/2014 at time of CHB.  Marland Kitchen Hyperglycemia   . Pulmonary nodule     a. Seen on CT 06/2012.    Past Surgical History  Procedure Laterality Date  . Appendectomy    . Cholecystectomy    . Permanent pacemaker insertion N/A 04/26/2014    Procedure: PERMANENT PACEMAKER INSERTION;  Surgeon: Marinus Maw, MD;  Location[facility] CATH LAB;  Service: Cardiovascular;  Laterality: N/A;    History  Substance Use Topics  . Smoking status: Never Smoker   . Smokeless tobacco: Never Used  . Alcohol Use: Yes     Comment: occ.    Family History  Problem Relation Age of Onset  . Colon cancer Neg Hx   . Rectal cancer Neg Hx   . Heart disease Sister   . Emphysema Sister     smoker    No Known Allergies  Medication list has been reviewed and updated.  Current Outpatient Prescriptions on File Prior to Visit  Medication Sig Dispense Refill  . BELVIQ 10 MG TABS Take 1 tablet by mouth  2 (two) times daily.  0  . estradiol (ESTRACE VAGINAL) 0.1 MG/GM vaginal cream Place 1 Applicatorful vaginally at bedtime. 42.5 g 12  . gabapentin (NEURONTIN) 300 MG capsule Take 300 mg by mouth 4 (four) times daily.  1  . HYDROcodone-acetaminophen (NORCO/VICODIN) 5-325 MG per tablet Take 1 tablet by mouth 3 (three) times daily as needed for moderate pain.   0  . lisinopril (PRINIVIL,ZESTRIL) 20 MG tablet Take 1 tablet (20 mg total) by mouth daily. 90 tablet 1  . mirtazapine (REMERON) 15 MG tablet Take 15 mg by mouth daily.  0  . pantoprazole (PROTONIX) 40 MG tablet Take 40 mg by mouth daily.  0  . simvastatin (ZOCOR) 20 MG tablet Take 20 mg by mouth daily.    . traMADol (ULTRAM) 50 MG tablet take 1 tablet by mouth every 6 hours if needed 120 tablet 5  . trimethoprim (TRIMPEX) 100 MG tablet Take 100 mg by mouth daily.  1   No current  facility-administered medications on file prior to visit.    Review of Systems:  As per HPI- otherwise negative.   Physical Examination: Filed Vitals:   06/01/14 1531  BP: 126/82  Pulse: 86  Temp: 98.3 F (36.8 C)  Resp: 20   Filed Vitals:   06/01/14 1531  Height: 5' (1.524 m)  Weight: 235 lb 2 oz (106.652 kg)   Body mass index is 45.92 kg/(m^2). Ideal Body Weight: Weight in (lb) to have BMI = 25: 127.7  GEN: WDWN, NAD, Non-toxic, A & O x 3, obesity, constant tremor.   HEENT: Atraumatic, Normocephalic. Neck supple. No masses, No LAD. Ears and Nose: No external deformity. CV: RRR, No M/G/R. No JVD. No thrill. No extra heart sounds. PULM: CTA B, no wheezes, crackles, rhonchi. No retractions. No resp. distress. No accessory muscle use. ABD: S, NT, ND, +BS. No rebound. No HSM. EXTR: No c/c/e NEURO slow gait, uses a rolling walker PSYCH: Normally interactive. Conversant. Not depressed or anxious appearing.  Calm demeanor.    Assessment and Plan: Movement disorder  Frail elderly  Low vision, both eyes  Darlene Parker is here today to do a face to face evaluation so she can get a home health aid.  Advised her that I am happy to do this but she will need to find out where I need to send this information.  She will do so and we will touch base next week  Signed Abbe Amsterdam, MD

## 2014-06-01 NOTE — Patient Instructions (Signed)
Call [facility] on Monday and find out where you need your face to face visit documentation sent- I will be glad to send this in for you

## 2014-06-05 ENCOUNTER — Telehealth: Payer: Self-pay | Admitting: Family Medicine

## 2014-06-08 NOTE — Telephone Encounter (Signed)
Have tried a few times now and been unable to reach her.  LMOM asking her to please let me know where to mail or fax info from last visit

## 2014-06-10 ENCOUNTER — Ambulatory Visit: Payer: Commercial Managed Care - HMO | Admitting: Family Medicine

## 2014-06-18 ENCOUNTER — Telehealth: Payer: Self-pay

## 2014-06-18 NOTE — Telephone Encounter (Signed)
Dr. Jacelyn Grip with [facility] called, she states the patient is not housebound.  She needs someone to wash her hair.    [phone]

## 2014-06-18 NOTE — Telephone Encounter (Signed)
Pt called wanting to give [name] a phone # [phone]. To call to find out where to send completed paperwork. It was very hard trying to figure out what she wanted. Gg  Please advise at [phone]

## 2014-06-18 NOTE — Telephone Encounter (Signed)
Spoke to Paisley- she suggests we order Home Aid for her. She does not qualify for a home health aid as she is not homebound nor does she have any health concerns relating to her general care needs. She needs an Aid.  How does this get ordered?

## 2014-06-18 NOTE — Telephone Encounter (Signed)
This is in regards to the home aid. Previous telephone message.

## 2014-06-19 NOTE — Telephone Encounter (Signed)
Spoke with Darlene Parker regarding home care referral. I explained to her that it was not likely that her insurance company would cover home care because she is not homebound. According to a prior note we spoke with [name] regarding home care and she recommended that we try to set her up with a home aid. I instructed Darlene Parker that [name] would be a good option for her, to help sort through this and set some sort of in home aid up for her. She agreed to Call [name] at [phone]. She was hesitant to call [name], she stated she called [name] and one of the sales reps told her she did qualify for in home care. I further explained to her that [name] agreed that [name] was the best route for her. She stated she would call back if [name] was not able to help her.

## 2014-06-24 ENCOUNTER — Telehealth: Payer: Self-pay

## 2014-06-24 NOTE — Telephone Encounter (Signed)
For regular eye exams pt does not need a referral.  LM to advise pt.

## 2014-06-24 NOTE — Telephone Encounter (Signed)
PATIENT WANTS [name] TO KNOW THAT SHE NEEDS TO GET A REFERRAL TO SEE AN EYE DOCTOR. SHE DID NOT KNOW THE EYE DOCTOR'S NAME. SHE JUST KNOWS HE IS OUT ON [address]. BEST PHONE [phone] (CELL)  MBC

## 2014-06-30 ENCOUNTER — Ambulatory Visit (INDEPENDENT_AMBULATORY_CARE_PROVIDER_SITE_OTHER): Payer: Commercial Managed Care - HMO | Admitting: Family Medicine

## 2014-06-30 ENCOUNTER — Ambulatory Visit (INDEPENDENT_AMBULATORY_CARE_PROVIDER_SITE_OTHER): Payer: Commercial Managed Care - HMO

## 2014-06-30 VITALS — BP 150/90 | HR 90 | Temp 97.3°F | Resp 20 | Ht 64.0 in | Wt 234.0 lb

## 2014-06-30 DIAGNOSIS — R0600 Dyspnea, unspecified: Secondary | ICD-10-CM

## 2014-06-30 DIAGNOSIS — R059 Cough, unspecified: Secondary | ICD-10-CM

## 2014-06-30 DIAGNOSIS — R002 Palpitations: Secondary | ICD-10-CM

## 2014-06-30 DIAGNOSIS — R05 Cough: Secondary | ICD-10-CM

## 2014-06-30 DIAGNOSIS — I517 Cardiomegaly: Secondary | ICD-10-CM

## 2014-06-30 LAB — POCT CBC
Granulocyte percent: 75.9 %G (ref 37–80)
HCT, POC: 42.3 % (ref 37.7–47.9)
Hemoglobin: 13.4 g/dL (ref 12.2–16.2)
Lymph, poc: 1.9 (ref 0.6–3.4)
MCH, POC: 30.9 pg (ref 27–31.2)
MCHC: 31.7 g/dL — AB (ref 31.8–35.4)
MCV: 97.5 fL — AB (ref 80–97)
MID (cbc): 0.3 (ref 0–0.9)
MPV: 7.6 fL (ref 0–99.8)
POC Granulocyte: 6.8 (ref 2–6.9)
POC LYMPH PERCENT: 21.3 %L (ref 10–50)
POC MID %: 2.8 %M (ref 0–12)
Platelet Count, POC: 193 10*3/uL (ref 142–424)
RBC: 4.34 M/uL (ref 4.04–5.48)
RDW, POC: 15.4 %
WBC: 9 10*3/uL (ref 4.6–10.2)

## 2014-06-30 MED ORDER — LEVOFLOXACIN 500 MG PO TABS: 500.0000 mg | ORAL_TABLET | Freq: Every day | ORAL | Status: DC

## 2014-06-30 MED ORDER — FUROSEMIDE 20 MG PO TABS: 20.0000 mg | ORAL_TABLET | Freq: Every day | ORAL | Status: DC

## 2014-06-30 NOTE — Patient Instructions (Addendum)
Follow up in 48 hours  You have an enlarged heart.  It is not keeping up with the needs of your kidney so we will increase flow through the kidneys to help the heart clear out the extra fluid elsewhere in your body.

## 2014-06-30 NOTE — Progress Notes (Signed)
79 yo recent pacemaker recipient because of complete heart block (about a month ago) by doctors at [facility].  Dr. Lewayne Bunting  She's had shortness of breath for years and years.  She has had several weeks of shortness of breath.  Associated with severe cough.  No fever.  Cough is productive of yellow mucous  No recent antibiotic.  She has taken theraflu and coricidine.  Slight chest pressure,  Chronic stomach pains which haven't changed.  Throat is scratchy  She uses a wheel walker.  Objective:  Dyspneic at rest.  Some ankle edema HEENT:  Unremarkable.  Large tongue.  Mild rhinophyma Chest:  Bibasilar rales Heart:  irreg irreg Ext:  1+ edema  EKG:  Pacer spikes, occasional PVC  Results for orders placed or performed in visit on 06/30/14  POCT CBC  Result Value Ref Range   WBC 9.0 4.6 - 10.2 K/uL   Lymph, poc 1.9 0.6 - 3.4   POC LYMPH PERCENT 21.3 10 - 50 %L   MID (cbc) 0.3 0 - 0.9   POC MID % 2.8 0 - 12 %M   POC Granulocyte 6.8 2 - 6.9   Granulocyte percent 75.9 37 - 80 %G   RBC 4.34 4.04 - 5.48 M/uL   Hemoglobin 13.4 12.2 - 16.2 g/dL   HCT, POC 41.4 23.9 - 47.9 %   MCV 97.5 (A) 80 - 97 fL   MCH, POC 30.9 27 - 31.2 pg   MCHC 31.7 (A) 31.8 - 35.4 g/dL   RDW, POC 53.2 %   Platelet Count, POC 193 142 - 424 K/uL   MPV 7.6 0 - 99.8 fL   [facility] reading (PRIMARY) by  Dr. Milus Glazier:  CXR:  Cardiomegaly, no infiltrates      ICD-9-CM ICD-10-CM   1. Dyspnea 786.09 R06.00 DG Chest 2 View     POCT CBC     furosemide (LASIX) 20 MG tablet     Comprehensive metabolic panel     Brain natriuretic peptide  2. Cough 786.2 R05 DG Chest 2 View     POCT CBC     furosemide (LASIX) 20 MG tablet     levofloxacin (LEVAQUIN) 500 MG tablet     Comprehensive metabolic panel     Brain natriuretic peptide  3. Palpitations 785.1 R00.2 EKG 12-Lead     EKG 12-Lead     furosemide (LASIX) 20 MG tablet     Comprehensive metabolic panel     Brain natriuretic peptide  4. Cardiomegaly 429.3  I51.7 furosemide (LASIX) 20 MG tablet     Comprehensive metabolic panel     Brain natriuretic peptide     Signed, Elvina Sidle, MD .

## 2014-06-30 NOTE — Addendum Note (Signed)
Addended byAlden Benjamin R on: 06/30/2014 05:12 PM   Modules accepted: Orders

## 2014-07-01 LAB — BRAIN NATRIURETIC PEPTIDE: Brain Natriuretic Peptide: 566.4 pg/mL — ABNORMAL HIGH (ref 0.0–100.0)

## 2014-07-03 ENCOUNTER — Other Ambulatory Visit: Payer: Commercial Managed Care - HMO

## 2014-07-03 ENCOUNTER — Ambulatory Visit (INDEPENDENT_AMBULATORY_CARE_PROVIDER_SITE_OTHER): Payer: Commercial Managed Care - HMO | Admitting: Family Medicine

## 2014-07-03 VITALS — BP 152/80 | HR 108 | Temp 98.1°F | Resp 22 | Wt 237.4 lb

## 2014-07-03 DIAGNOSIS — R05 Cough: Secondary | ICD-10-CM

## 2014-07-03 DIAGNOSIS — R002 Palpitations: Secondary | ICD-10-CM

## 2014-07-03 DIAGNOSIS — R059 Cough, unspecified: Secondary | ICD-10-CM

## 2014-07-03 DIAGNOSIS — R0600 Dyspnea, unspecified: Secondary | ICD-10-CM

## 2014-07-03 DIAGNOSIS — M4800 Spinal stenosis, site unspecified: Secondary | ICD-10-CM

## 2014-07-03 LAB — COMPLETE METABOLIC PANEL WITH GFR
ALT: 13 U/L (ref 0–35)
AST: 18 U/L (ref 0–37)
Albumin: 3.7 g/dL (ref 3.5–5.2)
Alkaline Phosphatase: 64 U/L (ref 39–117)
BUN: 14 mg/dL (ref 6–23)
CO2: 28 mEq/L (ref 19–32)
Calcium: 9.3 mg/dL (ref 8.4–10.5)
Chloride: 99 mEq/L (ref 96–112)
Creat: 0.86 mg/dL (ref 0.50–1.10)
GFR, Est African American: 76 mL/min
GFR, Est Non African American: 66 mL/min
Glucose, Bld: 143 mg/dL — ABNORMAL HIGH (ref 70–99)
Potassium: 4.3 mEq/L (ref 3.5–5.3)
Sodium: 137 mEq/L (ref 135–145)
Total Bilirubin: 0.6 mg/dL (ref 0.2–1.2)
Total Protein: 7 g/dL (ref 6.0–8.3)

## 2014-07-03 NOTE — Progress Notes (Signed)
This is a 79 year old woman with chronic back pain who presented just a couple days ago with a severe cough for a couple weeks. When I saw her she appeared to be in mild failure with bronchitis. She was given Levaquin and Lasix and since that day she has done much better with markedly decreased cough and shortness of breath.  Objective: Patient is using a wheeled walker. She is obese but cooperative and alert. HEENT: Unremarkable Neck: Supple no adenopathy or JVD Chest: Few basilar rales Heart: Regular without murmur  Assessment: Significant improvement over the last 3 days. I like to help the patient get neurosurgical evaluation from [facility] for her chronic spinal stenosis.  Plan: This chart was scribed in my presence and reviewed by me personally.    ICD-9-CM ICD-10-CM   1. Dyspnea 786.09 R06.00 COMPLETE METABOLIC PANEL WITH GFR  2. Cough 786.2 R05 COMPLETE METABOLIC PANEL WITH GFR  3. Palpitations 785.1 R00.2 COMPLETE METABOLIC PANEL WITH GFR  4. Spinal stenosis, unspecified spinal region 724.00 M48.00 Ambulatory referral to Neurosurgery     Signed, Elvina Sidle, MD

## 2014-07-05 ENCOUNTER — Telehealth: Payer: Self-pay

## 2014-07-05 NOTE — Telephone Encounter (Signed)
Pt called wanting to find out her lab results. She said she missed her call. Please advise at [phone]

## 2014-07-06 NOTE — Telephone Encounter (Signed)
See labs 

## 2014-07-08 ENCOUNTER — Other Ambulatory Visit: Payer: Self-pay | Admitting: Family Medicine

## 2014-07-08 ENCOUNTER — Telehealth: Payer: Self-pay

## 2014-07-08 DIAGNOSIS — R05 Cough: Secondary | ICD-10-CM

## 2014-07-08 DIAGNOSIS — I517 Cardiomegaly: Secondary | ICD-10-CM

## 2014-07-08 DIAGNOSIS — R0600 Dyspnea, unspecified: Secondary | ICD-10-CM

## 2014-07-08 DIAGNOSIS — R059 Cough, unspecified: Secondary | ICD-10-CM

## 2014-07-08 DIAGNOSIS — R002 Palpitations: Secondary | ICD-10-CM

## 2014-07-08 DIAGNOSIS — H269 Unspecified cataract: Secondary | ICD-10-CM

## 2014-07-08 MED ORDER — FUROSEMIDE 20 MG PO TABS: 20.0000 mg | ORAL_TABLET | Freq: Every day | ORAL | Status: DC

## 2014-07-08 NOTE — Telephone Encounter (Signed)
Please call patient with results of her lab tests

## 2014-07-08 NOTE — Telephone Encounter (Signed)
Pt called and wants to know if Dr. Elbert Ewings can give her some more water pills. Also wants to know about a referral to someone who can remove her cataract - not a general eye doctor. Also states she never received a call about lab results.  Please advise CB#  [phone]

## 2014-07-09 NOTE — Telephone Encounter (Signed)
Spoke to pt- she understands lab results and is waiting for our call for her appt.

## 2014-07-11 ENCOUNTER — Telehealth: Payer: Self-pay

## 2014-07-11 NOTE — Telephone Encounter (Signed)
Darlene Parker, case coordinator w/[name], called to req that Dr Patsy Lager send order and write LETTER OF MED NECESSITY d/t pt's inability to perform ADLs and IADLs to either [facility] care or [facility] for a [facility] aide to help pt w/bathing, dressing, meals, etc. The letter can be sent to [facility] at [phone] (this is actually a ph # though, so we will have to call to see where to fax). Selena Batten can be reached at [phone], ext [phone]. Pt lives at [facility], but these services will cost her from them. With a letter, there should be no cost to her through one of these to [facility] agencies.

## 2014-07-15 ENCOUNTER — Telehealth: Payer: Self-pay | Admitting: Family Medicine

## 2014-07-15 DIAGNOSIS — R54 Age-related physical debility: Secondary | ICD-10-CM

## 2014-07-15 NOTE — Telephone Encounter (Signed)
Angie please call this number and find out about letter

## 2014-07-15 NOTE — Telephone Encounter (Signed)
Spoke with Noel Christmas she states that patient need a referral to get Assistant from a home health care with her bathing, meals and cleaning of home. The referral has to say Medically Necessary along with home care Please fax referral to [phone] to get this authorized. [name] has found two facilities that can assist patient needs [facility] and [facility] [phone]  If you have any questions please call Ms singleton at [phone] ext [phone] if you have any questions

## 2014-07-16 NOTE — Telephone Encounter (Signed)
I did letter and will send off tomorrow

## 2014-07-17 ENCOUNTER — Other Ambulatory Visit: Payer: Self-pay | Admitting: Family Medicine

## 2014-07-18 NOTE — Telephone Encounter (Signed)
Called and [facility] for Darlene Parker- the fax number we got, [phone] did not work.  Can she please confirm if this is the correct number or call me on my cell with fax number for this letter

## 2014-07-30 ENCOUNTER — Encounter: Payer: Commercial Managed Care - HMO | Admitting: Internal Medicine

## 2014-08-02 ENCOUNTER — Encounter: Payer: Commercial Managed Care - HMO | Admitting: Internal Medicine

## 2014-08-07 ENCOUNTER — Encounter: Payer: Self-pay | Admitting: Internal Medicine

## 2014-08-07 ENCOUNTER — Ambulatory Visit (INDEPENDENT_AMBULATORY_CARE_PROVIDER_SITE_OTHER): Payer: Commercial Managed Care - HMO | Admitting: Internal Medicine

## 2014-08-07 DIAGNOSIS — I517 Cardiomegaly: Secondary | ICD-10-CM

## 2014-08-07 DIAGNOSIS — I442 Atrioventricular block, complete: Secondary | ICD-10-CM

## 2014-08-07 DIAGNOSIS — R059 Cough, unspecified: Secondary | ICD-10-CM

## 2014-08-07 DIAGNOSIS — R0600 Dyspnea, unspecified: Secondary | ICD-10-CM

## 2014-08-07 DIAGNOSIS — Z45018 Encounter for adjustment and management of other part of cardiac pacemaker: Secondary | ICD-10-CM

## 2014-08-07 DIAGNOSIS — R05 Cough: Secondary | ICD-10-CM

## 2014-08-07 DIAGNOSIS — R002 Palpitations: Secondary | ICD-10-CM

## 2014-08-07 DIAGNOSIS — I10 Essential (primary) hypertension: Secondary | ICD-10-CM

## 2014-08-07 LAB — MDC_IDC_ENUM_SESS_TYPE_INCLINIC
Battery Voltage: 2.8 V
Brady Statistic AP VS Percent: 0 %
Brady Statistic AS VP Percent: 99 %
Brady Statistic AS VS Percent: 0 %
Date Time Interrogation Session: 20160217154859
Lead Channel Impedance Value: 457 Ohm
Lead Channel Impedance Value: 495 Ohm
Lead Channel Pacing Threshold Pulse Width: 0.4 ms
Lead Channel Sensing Intrinsic Amplitude: 2.8 mV
Lead Channel Setting Pacing Pulse Width: 0.4 ms
MDC IDC MSMT BATTERY IMPEDANCE: 100 Ohm
MDC IDC MSMT BATTERY REMAINING LONGEVITY: 145 mo
MDC IDC MSMT LEADCHNL RA PACING THRESHOLD AMPLITUDE: 0.5 V
MDC IDC MSMT LEADCHNL RV PACING THRESHOLD AMPLITUDE: 0.5 V
MDC IDC MSMT LEADCHNL RV PACING THRESHOLD PULSEWIDTH: 0.4 ms
MDC IDC SET LEADCHNL RA PACING AMPLITUDE: 2 V
MDC IDC SET LEADCHNL RV PACING AMPLITUDE: 2.5 V
MDC IDC SET LEADCHNL RV SENSING SENSITIVITY: 2.8 mV
MDC IDC STAT BRADY AP VP PERCENT: 1 %

## 2014-08-07 MED ORDER — FUROSEMIDE 20 MG PO TABS: 40.0000 mg | ORAL_TABLET | Freq: Every day | ORAL | Status: AC

## 2014-08-07 NOTE — Assessment & Plan Note (Signed)
On my evaluation, the blood pressure was 148/94. We'll plan to up titrate her diuretic dose. She is encouraged to reduce her salt intake and lose weight.

## 2014-08-07 NOTE — Progress Notes (Signed)
HPI The patient is a 79 year old woman with a history of complete heart block, status post permanent pacemaker insertion, chronic dyspnea, morbid obesity, and COPD. In the interim, she has had worsening dyspnea. She also complains of trouble sleeping. No syncope. No anginal symptoms. Her mobility is limited by spinal stenosis. No Known Allergies   Current Outpatient Prescriptions  Medication Sig Dispense Refill  . furosemide (LASIX) 20 MG tablet Take 1 tablet (20 mg total) by mouth daily. 30 tablet 3  . gabapentin (NEURONTIN) 300 MG capsule Take 300 mg by mouth 4 (four) times daily.  1  . HYDROcodone-acetaminophen (NORCO/VICODIN) 5-325 MG per tablet Take 1 tablet by mouth 3 (three) times daily as needed for moderate pain.   0  . lisinopril (PRINIVIL,ZESTRIL) 20 MG tablet Take 1 tablet (20 mg total) by mouth daily. 90 tablet 1  . mirtazapine (REMERON) 15 MG tablet Take 15 mg by mouth daily.  0  . simvastatin (ZOCOR) 20 MG tablet Take 20 mg by mouth daily.    . traMADol (ULTRAM) 50 MG tablet take 1 tablet by mouth every 6 hours if needed 120 tablet 5   No current facility-administered medications for this visit.     Past Medical History  Diagnosis Date  . Arthritis     RA  . Scoliosis   . Essential hypertension   . Depression   . Anxiety   . Osteoporosis   . Movement disorder     a. Tremors.  . DDD (degenerative disc disease)   . CHB (complete heart block)     a. s/p Medtronic pacemaker 04/2014.  Marland Kitchen Generalized abdominal discomfort   . Chronic diastolic CHF (congestive heart failure)     a. Dx 04/2014 at time of CHB.  Marland Kitchen Hyperglycemia   . Pulmonary nodule     a. Seen on CT 06/2012.    ROS:   All systems reviewed and negative except as noted in the HPI.   Past Surgical History  Procedure Laterality Date  . Appendectomy    . Cholecystectomy    . Permanent pacemaker insertion N/A 04/26/2014    Procedure: PERMANENT PACEMAKER INSERTION;  Surgeon: Marinus Maw, MD;   Location: [facility] CATH LAB;  Service: Cardiovascular;  Laterality: N/A;     Family History  Problem Relation Age of Onset  . Colon cancer Neg Hx   . Rectal cancer Neg Hx   . Heart disease Sister   . Emphysema Sister     smoker     History   Social History  . Marital Status: Widowed    Spouse Name: N/A  . Number of Children: 3  . Years of Education: 13   Occupational History  . retired    Social History Main Topics  . Smoking status: Never Smoker   . Smokeless tobacco: Never Used  . Alcohol Use: Yes     Comment: occ.  . Drug Use: No  . Sexual Activity: Not on file   Other Topics Concern  . Not on file   Social History Narrative   Patient is a widow and lives alone.   Patient has three children.   Patient is retired.   Patient has a college education.   Patient is right handed.   Patient does drink soda rarely.     There were no vitals taken for this visit.  Physical Exam:  Chronically ill  appearing 79 year old woman, NAD HEENT: Unremarkable Neck:  8 cm JVD, no thyromegally Back:  No CVA tenderness Lungs:  Clear, except for rales in the bases approximately one fourth the way up. HEART:  Regular rate rhythm, no murmurs, no rubs, no clicks Abd:  soft, positive bowel sounds, no organomegally, no rebound, no guarding Ext:  2 plus pulses, 2+ peripheral edema, no cyanosis, no clubbing Skin:  No rashes no nodules Neuro:  CN II through XII intact, motor grossly intact   DEVICE  Normal device function.  See PaceArt for details.   Assess/Plan:

## 2014-08-07 NOTE — Patient Instructions (Addendum)
Your physician has recommended you make the following change in your medication:  1) INCREASE Furosemide to 40 mg daily  Remote monitoring is used to monitor your Pacemaker of ICD from home. This monitoring reduces the number of office visits required to check your device to one time per year. It allows [place] to keep an eye on the functioning of your device to ensure it is working properly. You are scheduled for a device check from home on 11/06/14. You may send your transmission at any time that day. If you have a wireless device, the transmission will be sent automatically. After your physician reviews your transmission, you will receive a postcard with your next transmission date.  Your physician wants you to follow-up in: 9 months with Dr. Ladona Ridgel.  You will receive a reminder letter in the mail two months in advance. If you don't receive a letter, please call our office to schedule the follow-up appointment.

## 2014-08-22 ENCOUNTER — Telehealth: Payer: Self-pay

## 2014-08-22 MED ORDER — MIRTAZAPINE 15 MG PO TABS: 15.0000 mg | ORAL_TABLET | Freq: Every day | ORAL | Status: AC

## 2014-08-22 NOTE — Telephone Encounter (Signed)
Patient needs refill for remeron sent to [facility]. She states the pharmacy closes at 6.

## 2014-08-22 NOTE — Telephone Encounter (Signed)
Spoke with pt, advised Rx was sent. 

## 2014-09-06 DIAGNOSIS — H269 Unspecified cataract: Secondary | ICD-10-CM | POA: Insufficient documentation

## 2014-09-09 ENCOUNTER — Ambulatory Visit: Payer: Commercial Managed Care - HMO

## 2014-09-16 ENCOUNTER — Telehealth: Payer: Self-pay

## 2014-10-01 ENCOUNTER — Telehealth: Payer: Self-pay | Admitting: Family Medicine

## 2014-10-01 NOTE — Telephone Encounter (Signed)
Received a death cert from [employer].  I had not been aware that she had died.  Called and spoke to her daughter- she was found unresponsive at home on 4/9, she had fallen at home. No autopsy was done.   Cause of death presumed to be cardiac arrest.  I did her death certificate and will fax so the funeral home may proceed with cremation

## 2014-10-20 DEATH — deceased

## 2014-11-04 ENCOUNTER — Encounter: Payer: Self-pay | Admitting: *Deleted

## 2014-11-04 DIAGNOSIS — H269 Unspecified cataract: Secondary | ICD-10-CM

## 2014-11-05 ENCOUNTER — Encounter: Payer: Self-pay | Admitting: *Deleted

## 2014-11-19 NOTE — Telephone Encounter (Signed)
No msg °

## 2014-11-28 ENCOUNTER — Ambulatory Visit: Payer: Commercial Managed Care - HMO | Admitting: Neurology

## 2015-03-03 IMAGING — CR DG CHEST 2V
1 series · 1 of 1 positions shown · non-contrast
Comparison: 04/26/2014

CLINICAL DATA: Pacemaker placement.

EXAM:
CHEST  2 VIEW

[view not recorded]
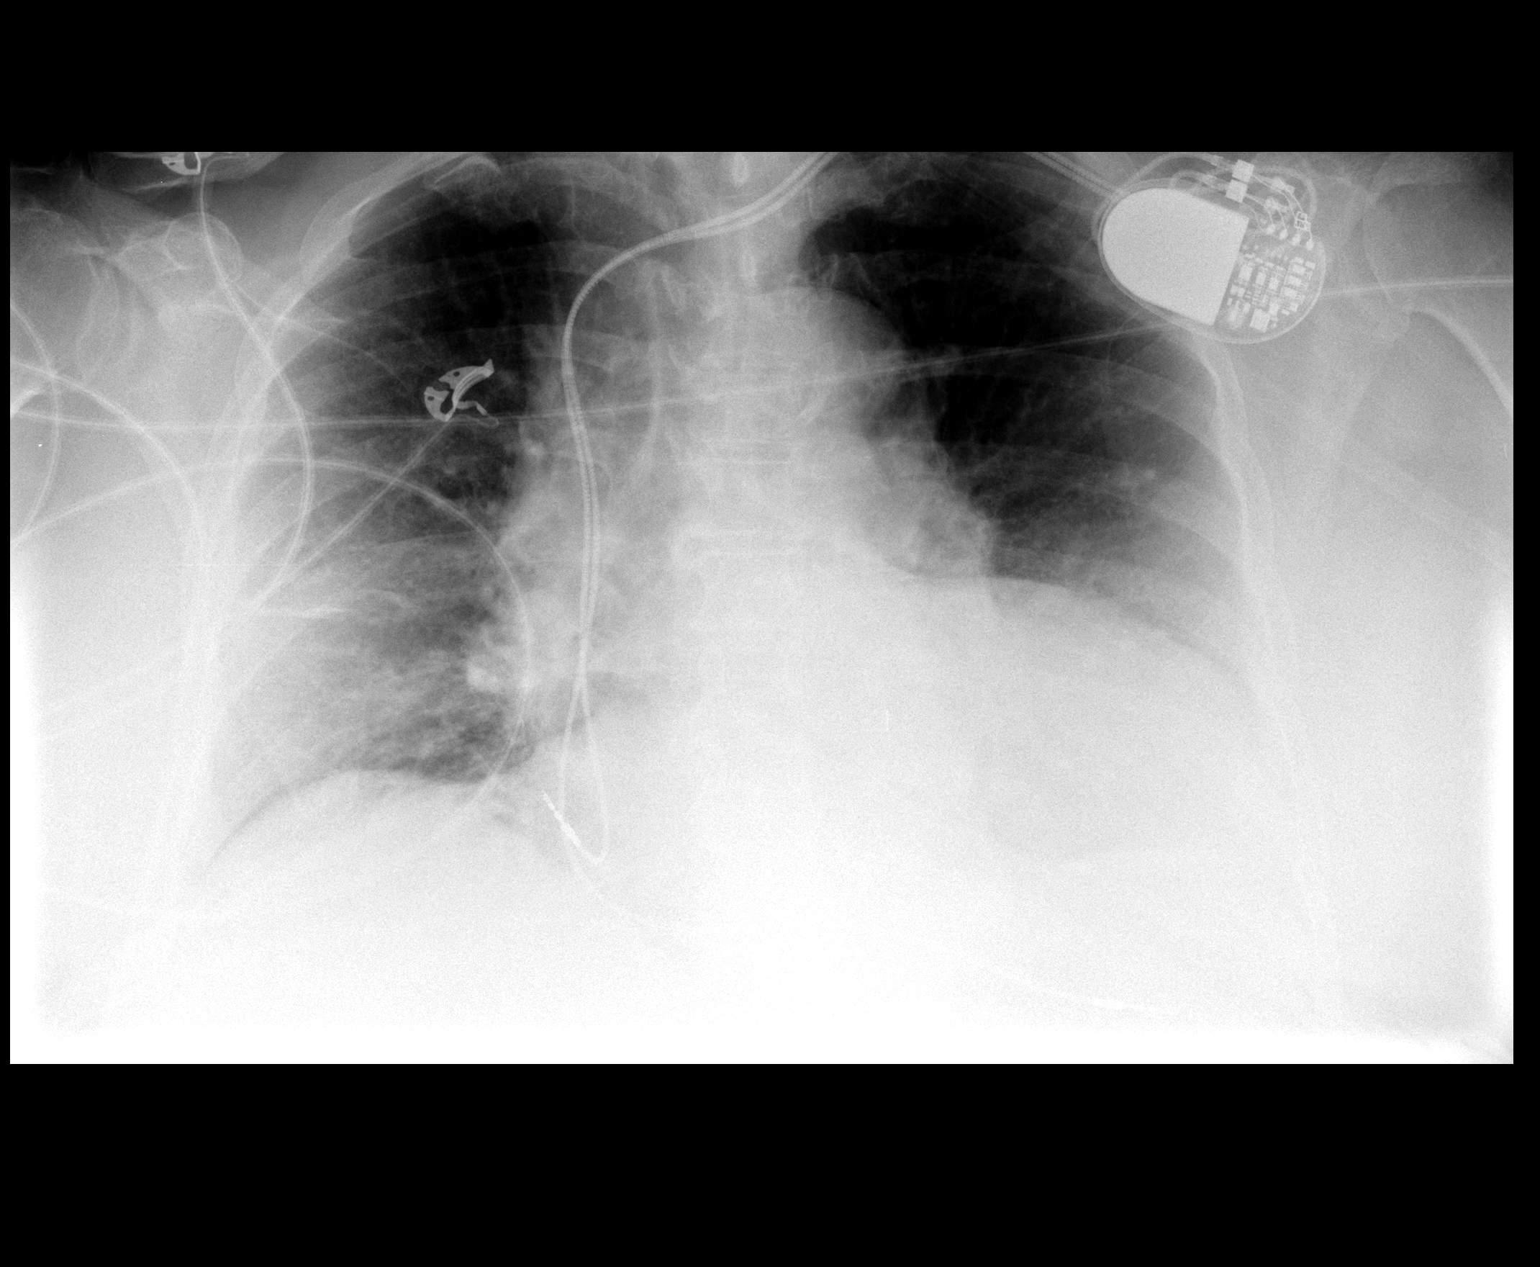

[1 of 1 positions shown; findings below may reference images not displayed]

FINDINGS: Stable cardiopericardial enlargement. Stable aortic contours. There
is a new pacer from the left, dual-chamber. Leads are in the apical
right ventricle and right atrial appendage region. No pneumothorax
or visible pleural effusion (posterior costophrenic sulci are
excluded).
IMPRESSION: New dual-chamber pacer from the left.  No adverse findings.

## 2015-03-04 IMAGING — CR DG CHEST 2V
2 series · 2 of 2 positions shown · non-contrast
Comparison: 04/27/2014

CLINICAL DATA: Cough, dyspnea

EXAM:
CHEST  2 VIEW

[w chest lat]
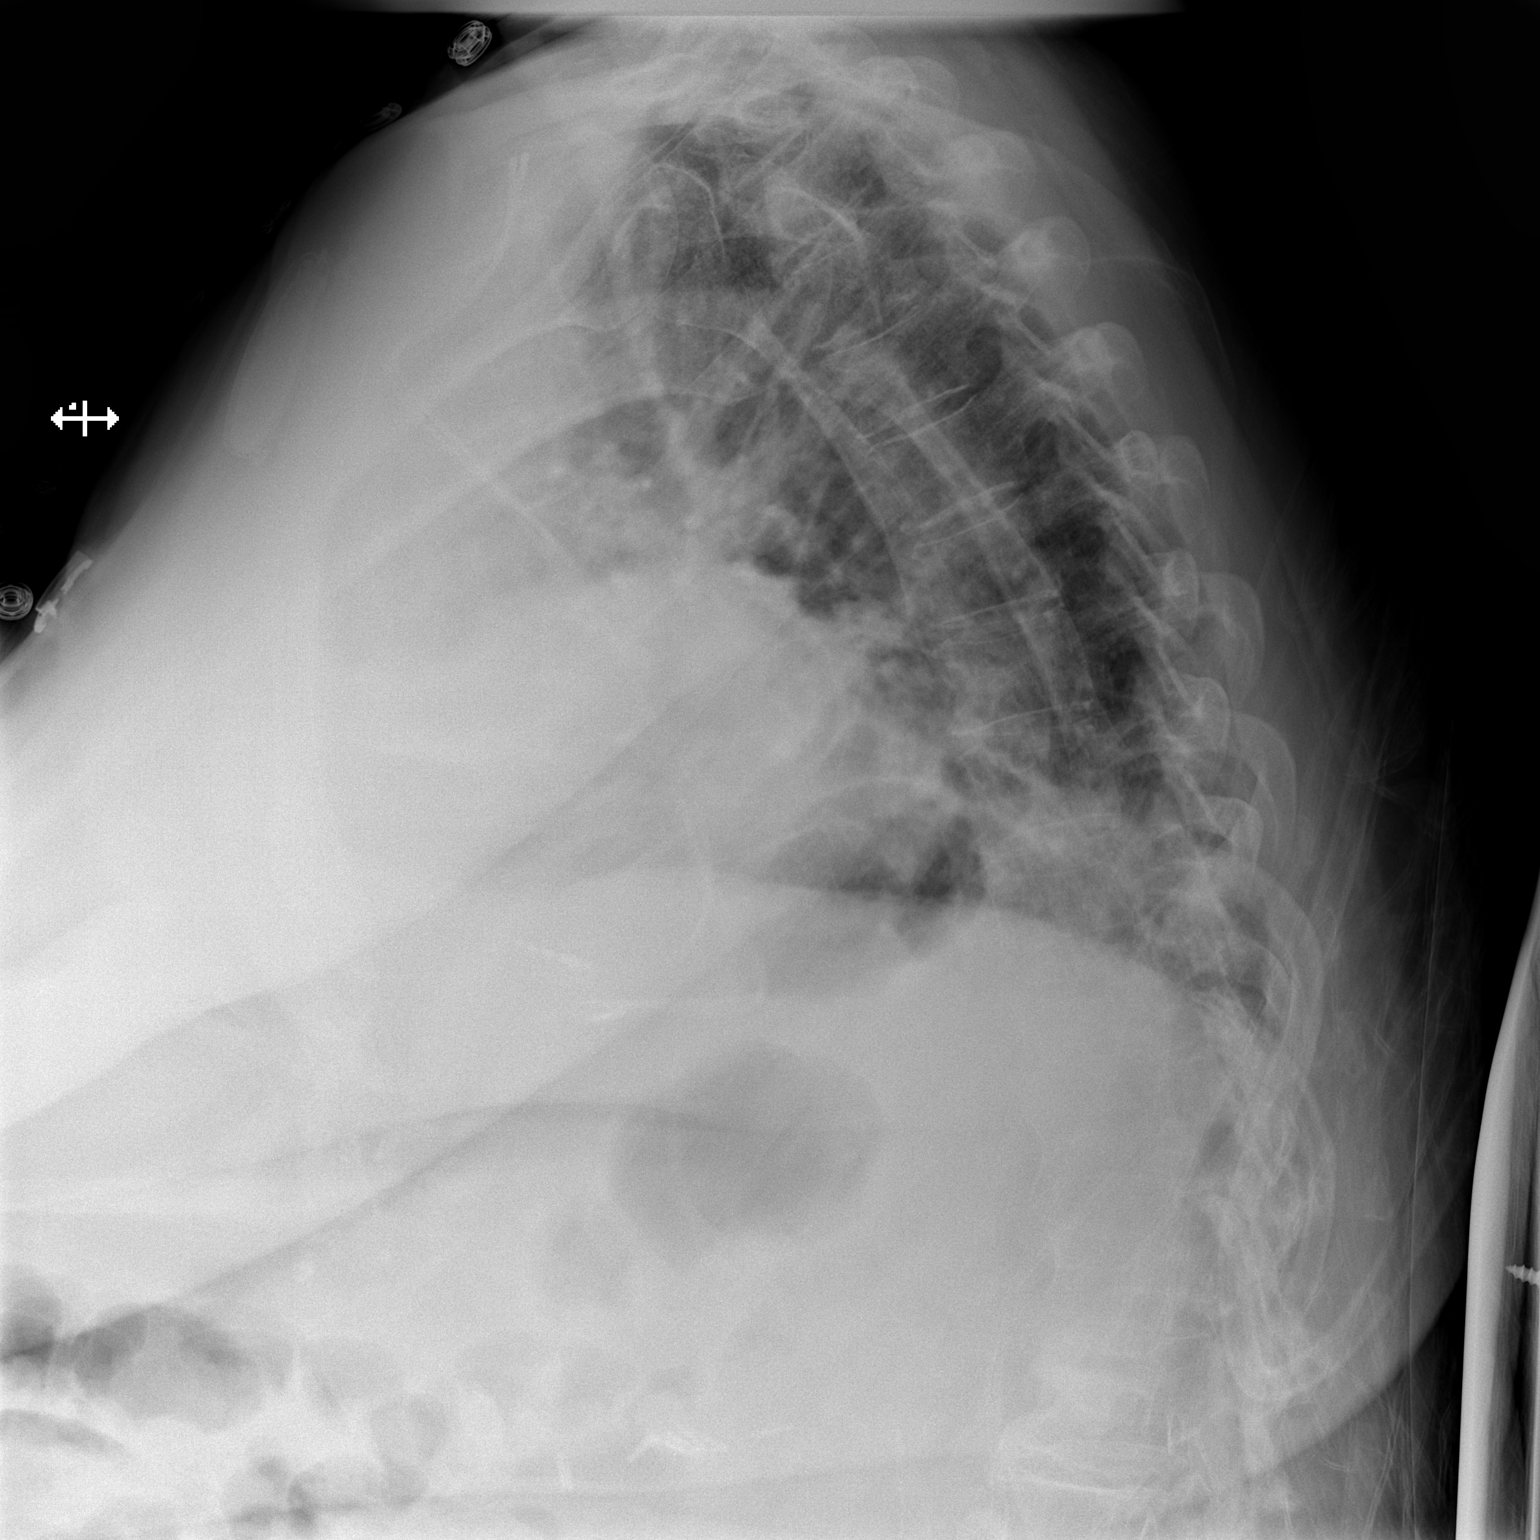

[x chest ap]
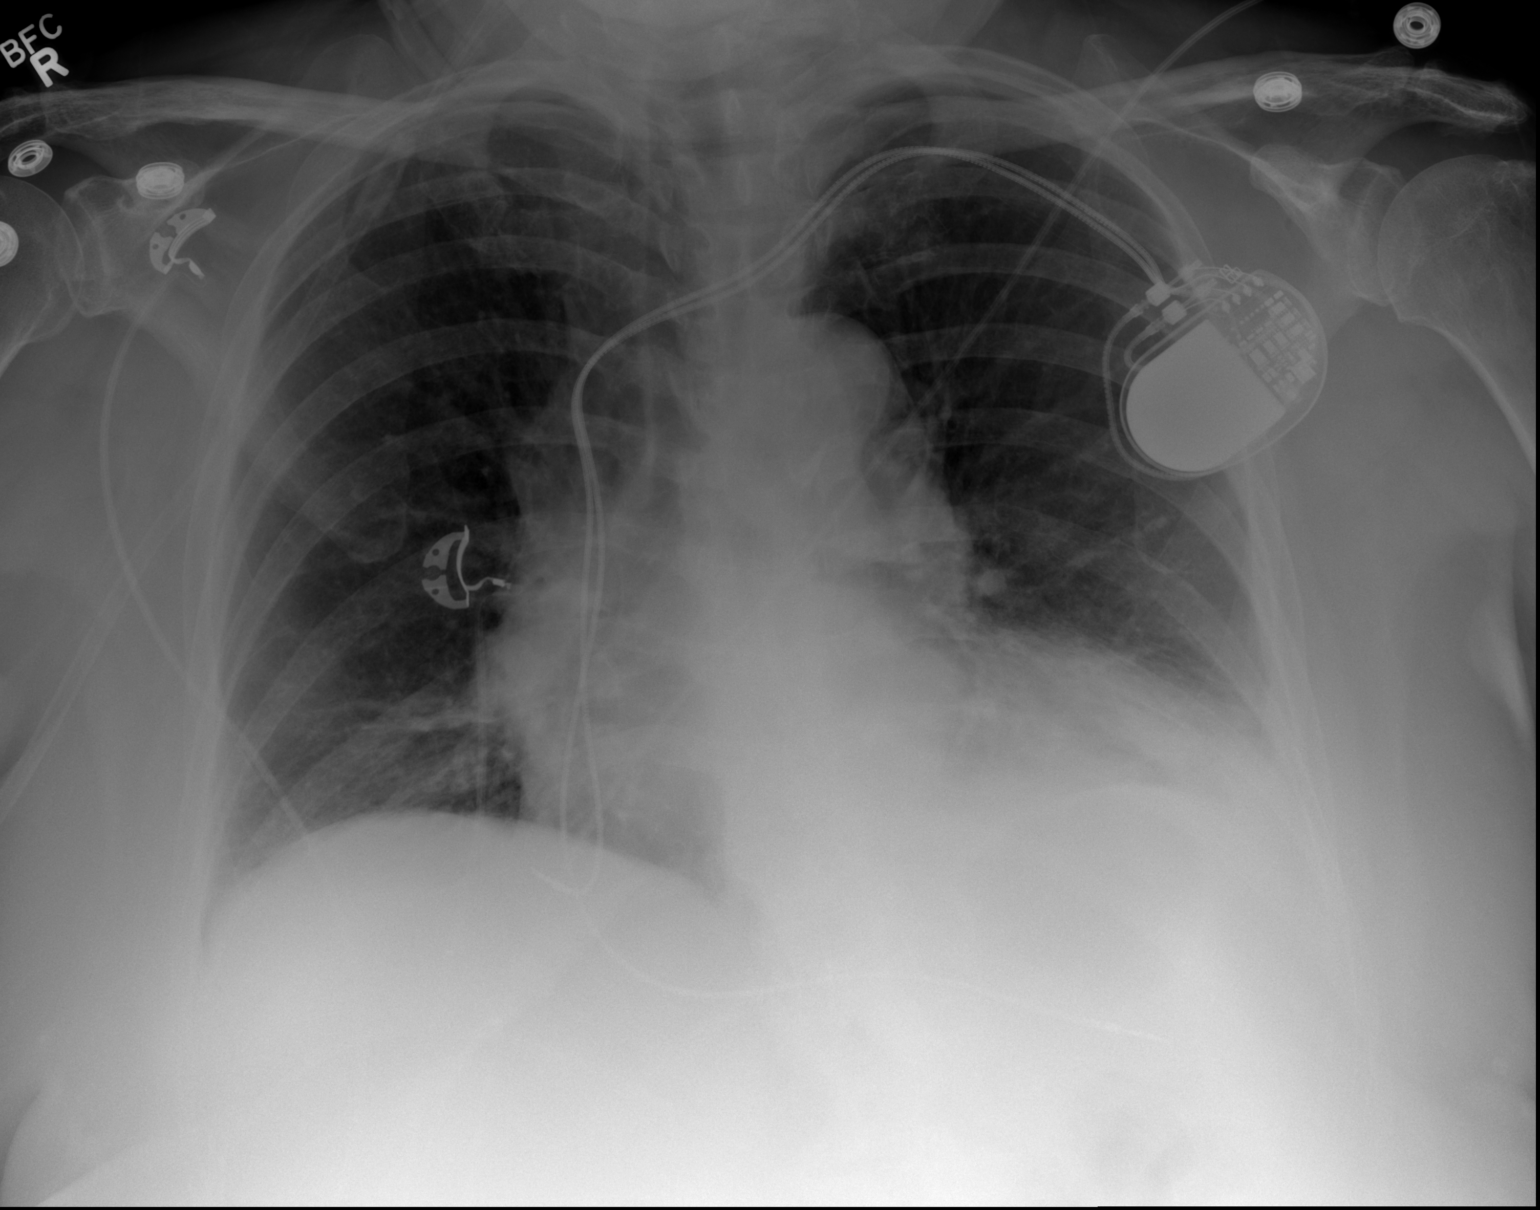

[2 of 2 positions shown; findings below may reference images not displayed]

FINDINGS: Dual lead left-sided pacer in place. Mild prominence of the
cardiomediastinal silhouette is reidentified with central vascular
congestion. A few peripheral interstitial Kerley B-lines are noted.
Trace pleural effusions are present. There is patchy retrocardiac
airspace opacity.
IMPRESSION: Low lung volumes with trace pleural effusions and possibly early
interstitial edema.

Patchy retrocardiac opacity which could represent atelectasis
although early pneumonia could appear similar. If symptoms persist,
consider PA and lateral chest radiographs obtained at full
inspiration when the patient is clinically able.
